# Patient Record
Sex: Female | Born: 1965 | Race: Black or African American | Hispanic: No | Smoking: Never smoker
Health system: Southern US, Community
[De-identification: ages and names within clinical notes are randomized; demographics above are authoritative.]

## PROBLEM LIST (undated history)

## (undated) DIAGNOSIS — H9190 Unspecified hearing loss, unspecified ear: Secondary | ICD-10-CM

## (undated) DIAGNOSIS — I10 Essential (primary) hypertension: Secondary | ICD-10-CM

## (undated) DIAGNOSIS — K219 Gastro-esophageal reflux disease without esophagitis: Secondary | ICD-10-CM

## (undated) DIAGNOSIS — R079 Chest pain, unspecified: Secondary | ICD-10-CM

## (undated) DIAGNOSIS — G5600 Carpal tunnel syndrome, unspecified upper limb: Secondary | ICD-10-CM

## (undated) DIAGNOSIS — M069 Rheumatoid arthritis, unspecified: Secondary | ICD-10-CM

## (undated) HISTORY — DX: Gastro-esophageal reflux disease without esophagitis: K21.9

## (undated) HISTORY — DX: Essential (primary) hypertension: I10

## (undated) HISTORY — DX: Carpal tunnel syndrome, unspecified upper limb: G56.00

## (undated) HISTORY — DX: Rheumatoid arthritis, unspecified: M06.9

## (undated) HISTORY — PX: LAPAROSCOPIC OVARIAN CYSTECTOMY: SUR786

---

## 2009-06-01 ENCOUNTER — Emergency Department (HOSPITAL_COMMUNITY): Admission: EM | Admit: 2009-06-01 | Discharge: 2009-06-01 | Payer: Self-pay | Admitting: Emergency Medicine

## 2010-09-25 LAB — CBC
MCHC: 31.1 g/dL (ref 30.0–36.0)
MCV: 77 fL — ABNORMAL LOW (ref 78.0–100.0)
Platelets: 230 10*3/uL (ref 150–400)
RBC: 3.99 MIL/uL (ref 3.87–5.11)
RDW: 17.5 % — ABNORMAL HIGH (ref 11.5–15.5)

## 2010-09-25 LAB — URINALYSIS, ROUTINE W REFLEX MICROSCOPIC
Glucose, UA: NEGATIVE mg/dL
Nitrite: NEGATIVE
Specific Gravity, Urine: 1.017 (ref 1.005–1.030)
pH: 6.5 (ref 5.0–8.0)

## 2010-09-25 LAB — COMPREHENSIVE METABOLIC PANEL
AST: 23 U/L (ref 0–37)
CO2: 25 mEq/L (ref 19–32)
Calcium: 8.9 mg/dL (ref 8.4–10.5)
Creatinine, Ser: 0.88 mg/dL (ref 0.4–1.2)
GFR calc Af Amer: >60 mL/min (ref >60)
GFR calc non Af Amer: >60 mL/min (ref >60)
Total Protein: 7.4 g/dL (ref 6.0–8.3)

## 2010-09-25 LAB — DIFFERENTIAL
Eosinophils Relative: 2 % (ref 0–5)
Lymphocytes Relative: 45 % (ref 12–46)
Lymphs Abs: 2.8 10*3/uL (ref 0.7–4.0)
Monocytes Relative: 10 % (ref 3–12)

## 2010-09-25 LAB — URINE MICROSCOPIC-ADD ON

## 2010-09-25 LAB — LIPASE, BLOOD: Lipase: 19 U/L (ref 11–59)

## 2011-05-15 ENCOUNTER — Emergency Department (HOSPITAL_COMMUNITY)
Admission: EM | Admit: 2011-05-15 | Discharge: 2011-05-15 | Disposition: A | Discharge status: Home / Self Care | Payer: Medicare Other | Attending: Emergency Medicine | Admitting: Emergency Medicine

## 2011-05-15 DIAGNOSIS — R059 Cough, unspecified: Secondary | ICD-10-CM | POA: Insufficient documentation

## 2011-05-15 DIAGNOSIS — R6883 Chills (without fever): Secondary | ICD-10-CM | POA: Insufficient documentation

## 2011-05-15 DIAGNOSIS — J069 Acute upper respiratory infection, unspecified: Secondary | ICD-10-CM | POA: Insufficient documentation

## 2011-05-15 DIAGNOSIS — R51 Headache: Secondary | ICD-10-CM | POA: Insufficient documentation

## 2011-05-15 DIAGNOSIS — R05 Cough: Secondary | ICD-10-CM | POA: Insufficient documentation

## 2011-05-15 DIAGNOSIS — R5381 Other malaise: Secondary | ICD-10-CM | POA: Insufficient documentation

## 2011-05-15 DIAGNOSIS — R5383 Other fatigue: Secondary | ICD-10-CM | POA: Insufficient documentation

## 2011-05-15 DIAGNOSIS — J3489 Other specified disorders of nose and nasal sinuses: Secondary | ICD-10-CM | POA: Insufficient documentation

## 2011-05-15 DIAGNOSIS — R112 Nausea with vomiting, unspecified: Secondary | ICD-10-CM | POA: Insufficient documentation

## 2011-05-15 HISTORY — DX: Unspecified hearing loss, unspecified ear: H91.90

## 2011-05-15 MED ORDER — BUTALBITAL-APAP-CAFFEINE 50-325-40 MG PO TABS: 2.0000 | ORAL_TABLET | Freq: Four times a day (QID) | ORAL | Status: AC | PRN

## 2011-05-15 MED ORDER — BENZONATATE 100 MG PO CAPS
200.0000 mg | ORAL_CAPSULE | Freq: Once | ORAL | Status: AC
  Administered 2011-05-15: 200 mg via ORAL
  Filled 2011-05-15: qty 2

## 2011-05-15 MED ORDER — BUTALBITAL-APAP-CAFFEINE 50-325-40 MG PO TABS
2.0000 | ORAL_TABLET | ORAL | Status: AC
  Administered 2011-05-15: 2 via ORAL
  Filled 2011-05-15: qty 2

## 2011-05-15 MED ORDER — BENZONATATE 200 MG PO CAPS: 200.0000 mg | ORAL_CAPSULE | Freq: Three times a day (TID) | ORAL | Status: AC | PRN

## 2011-05-15 MED ORDER — ONDANSETRON 4 MG PO TBDP
8.0000 mg | ORAL_TABLET | Freq: Once | ORAL | Status: AC
  Administered 2011-05-15: 8 mg via ORAL
  Filled 2011-05-15: qty 2

## 2011-05-15 MED ORDER — KETOROLAC TROMETHAMINE 60 MG/2ML IM SOLN
60.0000 mg | Freq: Once | INTRAMUSCULAR | Status: AC
  Administered 2011-05-15: 60 mg via INTRAMUSCULAR
  Filled 2011-05-15: qty 2

## 2011-05-15 NOTE — ED Provider Notes (Signed)
History     CSN: [id number] Arrival date & time: 05/15/2011 11:13 AM   First MD Initiated Contact with Patient 05/15/11 1202      Chief Complaint  Patient presents with  . Migraine    (Consider location/radiation/quality/duration/timing/severity/associated sxs/prior treatment) HPI The patient is a 45 year old female with history of hearing impairment who presents today complaining of a migraine. She states that she's had bilateral frontal headache for the past 2 days. There is then associated nausea and vomiting. There is no associated numbness, tingling, or paresthesias. Patient describes generalized weakness but nothing focal. She's had no visual changes and denies photophobia. She has been seen once for this previously during a prior episode and was told that everything was normal. She has not been able to take medications as she vomits when this occurs. She denies any fevers or neck pain. She states the pain is a 10 out of 10. It is made worse when she coughs and better with rest. Patient denies any chest pain or shortness of breath. She has no radiation of her symptoms. There are no other associated or modifying factors. This interview was conducted with the assistance of a family which interpreter. Past Medical History  Diagnosis Date  . Deaf     History reviewed. No pertinent past surgical history.  History reviewed. No pertinent family history.  History  Substance Use Topics  . Smoking status: Not on file  . Smokeless tobacco: Not on file  . Alcohol Use:     OB History    Grav Para Term Preterm Abortions TAB SAB Ect Mult Living                  Review of Systems  Constitutional: Positive for chills.  HENT: Positive for congestion.   Eyes: Negative.   Respiratory: Positive for cough.   Cardiovascular: Negative.   Gastrointestinal: Negative.   Genitourinary: Negative.   Musculoskeletal: Negative.   Neurological: Positive for headaches.  Hematological: Negative.    Psychiatric/Behavioral: Negative.   All other systems reviewed and are negative.    Allergies  Review of patient's allergies indicates no known allergies.  Home Medications   Current Outpatient Rx  Name Route Sig Dispense Refill  . BENZONATATE 200 MG PO CAPS Oral Take 1 capsule (200 mg total) by mouth 3 (three) times daily as needed for cough. 20 capsule 0  . BUTALBITAL-APAP-CAFFEINE 50-325-40 MG PO TABS Oral Take 2 tablets by mouth every 6 (six) hours as needed for headache. 14 tablet 0    BP 117/80  Pulse 95  Temp(Src) 98.5 F (36.9 C) (Oral)  Wt 145 lb (65.772 kg)  SpO2 100%  LMP 04/30/2011  Physical Exam  Nursing note and vitals reviewed. Constitutional: She is oriented to person, place, and time. She appears well-developed and well-nourished. No distress.  HENT:  Head: Normocephalic and atraumatic.  Eyes: Conjunctivae and EOM are normal. Pupils are equal, round, and reactive to light.  Neck: Normal range of motion.  Cardiovascular: Normal rate, regular rhythm, normal heart sounds and intact distal pulses.  Exam reveals no gallop and no friction rub.   No murmur heard. Pulmonary/Chest: Effort normal and breath sounds normal. No respiratory distress. She has no wheezes. She has no rales.  Abdominal: Soft. Bowel sounds are normal. She exhibits no distension. There is no tenderness. There is no rebound and no guarding.  Musculoskeletal: Normal range of motion. She exhibits no edema and no tenderness.  Neurological: She is alert and oriented to person,  place, and time. She exhibits normal muscle tone. Coordination normal.       No cranial nerve deficit aside from hearing impairment  Skin: Skin is warm and dry. No rash noted.  Psychiatric: She has a normal mood and affect.    ED Course  Procedures (including critical care time)  Labs Reviewed - No data to display No results found.   1. Acute URI   2. Headache       MDM  The patient was evaluated by myself.  Based on her history patient's symptoms were similar to prior episode. She did not have any concerning neurologic findings. Patient did have complained of being unable to take medications secondary to nausea. She was given Zofran ODT. Following this patient was given dose of Fioricet. Patient did complain of having intermittent cough associated with symptoms that sounded like upper respiratory tract infection. No further testing was felt necessary today. She will be discharged with a prescription for Tessalon Perles in addition to Fioricet.  Patient also received a dose of Toradol prior to discharge.      Cyndra Numbers, MD 05/15/11 [phone]

## 2011-05-15 NOTE — ED Notes (Signed)
Pt presents with 2 day h/o migraine headache.  Pt reports headache that has been intermittent since 11/7.  Pt reports pain is frontal with soreness to her eyes.  Pt denies any vision change.  +nausea and vomiting, +photophobia.   Pt reports she normally gets migraines with her periods, LMP 11/7.

## 2013-01-11 ENCOUNTER — Encounter: Payer: Self-pay | Admitting: Obstetrics & Gynecology

## 2013-01-13 ENCOUNTER — Encounter: Payer: Self-pay | Admitting: Obstetrics & Gynecology

## 2013-03-03 ENCOUNTER — Ambulatory Visit: Payer: Self-pay | Admitting: Obstetrics & Gynecology

## 2013-03-03 ENCOUNTER — Ambulatory Visit (INDEPENDENT_AMBULATORY_CARE_PROVIDER_SITE_OTHER): Payer: Medicare Other

## 2013-03-03 ENCOUNTER — Other Ambulatory Visit: Payer: Self-pay | Admitting: Obstetrics & Gynecology

## 2013-03-03 ENCOUNTER — Ambulatory Visit (INDEPENDENT_AMBULATORY_CARE_PROVIDER_SITE_OTHER): Payer: 59 | Admitting: Obstetrics & Gynecology

## 2013-03-03 ENCOUNTER — Encounter: Payer: Self-pay | Admitting: Obstetrics & Gynecology

## 2013-03-03 VITALS — BP 160/98 | HR 68 | Temp 98.2°F | Ht 66.0 in | Wt 129.8 lb

## 2013-03-03 DIAGNOSIS — N920 Excessive and frequent menstruation with regular cycle: Secondary | ICD-10-CM

## 2013-03-03 DIAGNOSIS — N760 Acute vaginitis: Secondary | ICD-10-CM

## 2013-03-03 DIAGNOSIS — N946 Dysmenorrhea, unspecified: Secondary | ICD-10-CM

## 2013-03-03 DIAGNOSIS — D259 Leiomyoma of uterus, unspecified: Secondary | ICD-10-CM

## 2013-03-03 DIAGNOSIS — N898 Other specified noninflammatory disorders of vagina: Secondary | ICD-10-CM

## 2013-03-03 DIAGNOSIS — N926 Irregular menstruation, unspecified: Secondary | ICD-10-CM

## 2013-03-03 LAB — CBC
HCT: 36.6 % (ref 36.0–46.0)
Hemoglobin: 10.6 g/dL — ABNORMAL LOW (ref 12.0–15.0)
MCHC: 29 g/dL — ABNORMAL LOW (ref 30.0–36.0)
WBC: 5.5 10*3/uL (ref 4.0–10.5)

## 2013-03-03 LAB — POCT WET PREP (WET MOUNT): Clue Cells Wet Prep Whiff POC: NEGATIVE

## 2013-03-03 NOTE — Progress Notes (Signed)
.   Subjective:     Tina Gray is a 47 y.o. female here for a routine exam.  Current complaints: vaginal itching and irritation.  Heavy, painful long cycles last about two weeks.  Personal health questionnaire reviewed: no.   Gynecologic History No LMP recorded. Contraception: none Last Pap: 11/2011. Results were: normal Last mammogram: 10/2012. Results were: normal  Obstetric History OB History  No data available     The following portions of the patient's history were reviewed and updated as appropriate: allergies, current medications, past family history, past medical history, past social history, past surgical history and problem list.  Review of Systems Pertinent items are noted in HPI.    Objective:   General appearance: alert Breasts: normal appearance, no masses or tenderness Abdomen: soft, non-tender; bowel sounds normal; no masses,  no organomegaly Pelvic: cervix normal in appearance, external genitalia normal, no adnexal masses or tenderness, uterus slightly enlarged, irregular contour  and vagina normal without discharge    Assessment:   AUB--L,O ?Vaginitis  Plan:   Orders Placed This Encounter  Procedures  . WET PREP BY MOLECULAR PROBE  . GC/Chlamydia Probe Amp  . CBC  . TSH  . Prolactin  Return in 2 weeks

## 2013-03-03 NOTE — Patient Instructions (Signed)
Fibroids Fibroids are lumps (tumors) that can occur any place in a woman's body. These lumps are not cancerous. Fibroids vary in size, weight, and where they grow. HOME CARE  Do not take aspirin.  Write down the number of pads or tampons you use during your period. Tell your doctor. This can help determine the best treatment for you. GET HELP RIGHT AWAY IF:  You have pain in your lower belly (abdomen) that is not helped with medicine.  You have cramps that are not helped with medicine.  You have more bleeding between or during your period.  You feel lightheaded or pass out (faint).  Your lower belly pain gets worse. MAKE SURE YOU:  Understand these instructions.  Will watch your condition.  Will get help right away if you are not doing well or get worse. Document Released: 07/13/2010 Document Revised: 09/02/2011 Document Reviewed: 07/13/2010 [facility] Patient Information 2014 [place], [place]. Abnormal Uterine Bleeding Abnormal uterine bleeding can have many causes. Some cases are simply treated, while others are more serious. There are several kinds of bleeding that is considered abnormal, including:  Bleeding between periods.  Bleeding after sexual intercourse.  Spotting anytime in the menstrual cycle.  Bleeding heavier or more than normal.  Bleeding after menopause. CAUSES  There are many causes of abnormal uterine bleeding. It can be present in teenagers, pregnant women, women during their reproductive years, and women who have reached menopause. Your caregiver will look for the more common causes depending on your age, signs, symptoms and your particular circumstance. Most cases are not serious and can be treated. Even the more serious causes, like cancer of the female organs, can be treated adequately if found in the early stages. That is why all types of bleeding should be evaluated and treated as soon as possible. DIAGNOSIS  Diagnosing the cause may take several kinds  of tests. Your caregiver may:  Take a complete history of the type of bleeding.  Perform a complete physical exam and Pap smear.  Take an ultrasound on the abdomen showing a picture of the female organs and the pelvis.  Inject dye into the uterus and Fallopian tubes and X-ray them (hysterosalpingogram).  Place fluid in the uterus and do an ultrasound (sonohysterogrqphy).  Take a CT scan to examine the female organs and pelvis.  Take an MRI to examine the female organs and pelvis. There is no X-ray involved with this procedure.  Look inside the uterus with a telescope that has a light at the end (hysteroscopy).  Scrap the inside of the uterus to get tissue to examine (Dilatation and Curettage, D&C).  Look into the pelvis with a telescope that has a light at the end (laparoscopy). This is done through a very small cut (incision) in the abdomen. TREATMENT  Treatment will depend on the cause of the abnormal bleeding. It can include:  Doing nothing to allow the problem to take care of itself over time.  Hormone treatment.  Birth control pills.  Treating the medical condition causing the problem.  Laparoscopy.  Major or minor surgery  Destroying the lining of the uterus with electrical currant, laser, freezing or heat (uterine ablation). HOME CARE INSTRUCTIONS   Follow your caregiver's recommendation on how to treat your problem.  See your caregiver if you missed a menstrual period and think you may be pregnant.  If you are bleeding heavily, count the number of pads/tampons you use and how often you have to change them. Tell this to your caregiver.  Avoid sexual intercourse until the problem is controlled. SEEK MEDICAL CARE IF:   You have any kind of abnormal bleeding mentioned above.  You feel dizzy at times.  You are 47 years old and have not had a menstrual period yet. SEEK IMMEDIATE MEDICAL CARE IF:   You pass out.  You are changing pads/tampons every 15 to 30  minutes.  You have belly (abdominal) pain.  You have a temperature of 100 F (37.8 C) or higher.  You become sweaty or weak.  You are passing large blood clots from the vagina.  You start to feel sick to your stomach (nauseous) and throw up (vomit). Document Released: 06/10/2005 Document Revised: 09/02/2011 Document Reviewed: 11/03/2008 [facility] Patient Information 2014 [place], [place].

## 2013-03-04 LAB — WET PREP BY MOLECULAR PROBE
Candida species: NEGATIVE
Gardnerella vaginalis: POSITIVE — AB

## 2013-03-04 LAB — PROLACTIN: Prolactin: 4.4 ng/mL

## 2013-03-04 LAB — GC/CHLAMYDIA PROBE AMP: CT Probe RNA: NEGATIVE

## 2013-03-05 ENCOUNTER — Encounter: Payer: Self-pay | Admitting: Obstetrics & Gynecology

## 2013-03-05 DIAGNOSIS — D509 Iron deficiency anemia, unspecified: Secondary | ICD-10-CM | POA: Insufficient documentation

## 2013-03-15 ENCOUNTER — Encounter: Payer: Self-pay | Admitting: Obstetrics & Gynecology

## 2013-03-15 ENCOUNTER — Ambulatory Visit (INDEPENDENT_AMBULATORY_CARE_PROVIDER_SITE_OTHER): Payer: Medicare Other | Admitting: Obstetrics & Gynecology

## 2013-03-15 VITALS — BP 155/92 | HR 73 | Temp 98.2°F | Ht 66.0 in | Wt 127.2 lb

## 2013-03-15 DIAGNOSIS — N926 Irregular menstruation, unspecified: Secondary | ICD-10-CM

## 2013-03-15 DIAGNOSIS — Z23 Encounter for immunization: Secondary | ICD-10-CM

## 2013-03-15 DIAGNOSIS — N939 Abnormal uterine and vaginal bleeding, unspecified: Secondary | ICD-10-CM

## 2013-03-15 NOTE — Progress Notes (Signed)
.   Subjective:     Tina Gray is a 47 y.o. female here for a follow up exam.  No current complaints.  Personal health questionnaire reviewed: no.   Gynecologic History Patient's last menstrual period was 02/02/2013. Contraception: none Last Pap: 2013. Results were: normal Last mammogram: 2014. Results were: normal  Obstetric History OB History  Gravida Para Term Preterm AB SAB TAB Ectopic Multiple Living  2 2        2     # Outcome Date GA Lbr Len/2nd Weight Sex Delivery Anes PTL Lv  2 PAR           1 PAR                The following portions of the patient's history were reviewed and updated as appropriate: allergies, current medications, past family history, past medical history, past social history, past surgical history and problem list.  Review of Systems Pertinent items are noted in HPI.    Objective:   No exam today  Assessment:   AUB-O, perimenopausal Labs, U/S results reviewed  Plan:    Return as needed

## 2013-03-17 ENCOUNTER — Encounter: Payer: Self-pay | Admitting: Obstetrics & Gynecology

## 2013-03-17 DIAGNOSIS — N939 Abnormal uterine and vaginal bleeding, unspecified: Secondary | ICD-10-CM | POA: Insufficient documentation

## 2013-03-17 NOTE — Patient Instructions (Signed)
Influenza Vaccine (Flu Vaccine, Inactivated) 2013 2014 What You Need to Know WHY GET VACCINATED?  Influenza ("flu") is a contagious disease that spreads around the [place] every winter, usually between October and May.  Flu is caused by the influenza virus, and can be spread by coughing, sneezing, and close contact.  Anyone can get flu, but the risk of getting flu is highest among children. Symptoms come on suddenly and may last several days. They can include:  Fever or chills.  Sore throat.  Muscle aches.  Fatigue.  Cough.  Headache.  Runny or stuffy nose. Flu can make some people much sicker than others. These people include young children, people 65 and older, pregnant women, and people with certain health conditions such as heart, lung or kidney disease, or a weakened immune system. Flu vaccine is especially important for these people, and anyone in close contact with them. Flu can also lead to pneumonia, and make existing medical conditions worse. It can cause diarrhea and seizures in children. Each year thousands of people in the [place] die from flu, and many more are hospitalized. Flu vaccine is the best protection we have from flu and its complications. Flu vaccine also helps prevent spreading flu from person to person. INACTIVATED FLU VACCINE There are 2 types of influenza vaccine:  You are getting an inactivated flu vaccine, which does not contain any live influenza virus. It is given by injection with a needle, and often called the "flu shot."  A different live, attenuated (weakened) influenza vaccine is sprayed into the nostrils. This vaccine is described in a separate Vaccine Information Statement. Flu vaccine is recommended every year. Children 6 months through 8 years of age should get 2 doses the first year they get vaccinated. Flu viruses are always changing. Each year's flu vaccine is made to protect from viruses that are most likely to cause disease  that year. While flu vaccine cannot prevent all cases of flu, it is our best defense against the disease. Inactivated flu vaccine protects against 3 or 4 different influenza viruses. It takes about 2 weeks for protection to develop after the vaccination, and protection lasts several months to a year. Some illnesses that are not caused by influenza virus are often mistaken for flu. Flu vaccine will not prevent these illnesses. It can only prevent influenza. A "high-dose" flu vaccine is available for people 65 years of age and older. The person giving you the vaccine can tell you more about it. Some inactivated flu vaccine contains a very small amount of a mercury-based preservative called thimerosal. Studies have shown that thimerosal in vaccines is not harmful, but flu vaccines that do not contain a preservative are available. SOME PEOPLE SHOULD NOT GET THIS VACCINE Tell the person who gives you the vaccine:  If you have any severe (life-threatening) allergies. If you ever had a life-threatening allergic reaction after a dose of flu vaccine, or have a severe allergy to any part of this vaccine, you may be advised not to get a dose. Most, but not all, types of flu vaccine contain a small amount of egg.  If you ever had Guillain Barr Syndrome (a severe paralyzing illness, also called GBS). Some people with a history of GBS should not get this vaccine. This should be discussed with your doctor.  If you are not feeling well. They might suggest waiting until you feel better. But you should come back. RISKS OF A VACCINE REACTION With a vaccine, like any medicine, there   is a chance of side effects. These are usually mild and go away on their own. Serious side effects are also possible, but are very rare. Inactivated flu vaccine does not contain live flu virus, sogetting flu from this vaccine is not possible. Brief fainting spells and related symptoms (such as jerking movements) can happen after any medical  procedure, including vaccination. Sitting or lying down for about 15 minutes after a vaccination can help prevent fainting and injuries caused by falls. Tell your doctor if you feel dizzy or lightheaded, or have vision changes or ringing in the ears. Mild problems following inactivated flu vaccine:  Soreness, redness, or swelling where the shot was given.  Hoarseness; sore, red or itchy eyes; or cough.  Fever.  Aches.  Headache.  Itching.  Fatigue. If these problems occur, they usually begin soon after the shot and last 1 or 2 days. Moderate problems following inactivated flu vaccine:  Young children who get inactivated flu vaccine and pneumococcal vaccine (PCV13) at the same time may be at increased risk for seizures caused by fever. Ask your doctor for more information. Tell your doctor if a child who is getting flu vaccine has ever had a seizure. Severe problems following inactivated flu vaccine:  A severe allergic reaction could occur after any vaccine (estimated less than 1 in a million doses).  There is a small possibility that inactivated flu vaccine could be associated with Guillan Barr Syndrome (GBS), no more than 1 or 2 cases per million people vaccinated. This is much lower than the risk of severe complications from flu, which can be prevented by flu vaccine. The safety of vaccines is always being monitored. For more information, visit: www.cdc.gov/vaccinesafety/ WHAT IF THERE IS A SERIOUS REACTION? What should I look for?  Look for anything that concerns you, such as signs of a severe allergic reaction, very high fever, or behavior changes. Signs of a severe allergic reaction can include hives, swelling of the face and throat, difficulty breathing, a fast heartbeat, dizziness, and weakness. These would start a few minutes to a few hours after the vaccination. What should I do?  If you think it is a severe allergic reaction or other emergency that cannot wait, call 9 1 1  or get the person to the nearest hospital. Otherwise, call your doctor.  Afterward, the reaction should be reported to the Vaccine Adverse Event Reporting System (VAERS). Your doctor might file this report, or you can do it yourself through the VAERS website at www.vaers.hhs.gov, or by calling 1-800-822-7967. VAERS is only for reporting reactions. They do not give medical advice. THE NATIONAL VACCINE INJURY COMPENSATION PROGRAM The National Vaccine Injury Compensation Program (VICP) is a federal program that was created to compensate people who may have been injured by certain vaccines. Persons who believe they may have been injured by a vaccine can learn about the program and about filing a claim by calling 1-800-338-2382 or visiting the VICP website at www.hrsa.gov/vaccinecompensation HOW CAN I LEARN MORE?  Ask your doctor.  Call your local or state health department.  Contact the Centers for Disease Control and Prevention (CDC):  Call 1-800-232-4636 (1-800-CDC-INFO) or  Visit CDC's website at www.cdc.gov/flu CDC Inactivated Influenza Vaccine Interim VIS (01/17/12) Document Released: 04/04/2006 Document Revised: 03/04/2012 Document Reviewed: 01/17/2012 [employer] Patient Information 2014 [employer].  

## 2013-03-18 ENCOUNTER — Ambulatory Visit: Payer: Medicare Other | Admitting: Obstetrics & Gynecology

## 2013-04-05 ENCOUNTER — Telehealth: Payer: Self-pay | Admitting: *Deleted

## 2013-04-05 DIAGNOSIS — B373 Candidiasis of vulva and vagina: Secondary | ICD-10-CM

## 2013-04-05 MED ORDER — FLUCONAZOLE 150 MG PO TABS: 150.0000 mg | ORAL_TABLET | ORAL | Status: DC

## 2013-04-05 NOTE — Telephone Encounter (Signed)
Patient c/o yeast symptoms and would like treatment. Rx sent to patient's pharmacy per office protocol.

## 2013-04-06 ENCOUNTER — Encounter: Payer: Self-pay | Admitting: Obstetrics & Gynecology

## 2013-06-02 ENCOUNTER — Emergency Department (HOSPITAL_COMMUNITY)
Admission: EM | Admit: 2013-06-02 | Discharge: 2013-06-02 | Disposition: A | Discharge status: Home / Self Care | Payer: BC Managed Care – PPO | Attending: Emergency Medicine | Admitting: Emergency Medicine

## 2013-06-02 ENCOUNTER — Encounter (HOSPITAL_COMMUNITY): Payer: Self-pay | Admitting: Emergency Medicine

## 2013-06-02 DIAGNOSIS — R109 Unspecified abdominal pain: Secondary | ICD-10-CM

## 2013-06-02 DIAGNOSIS — Z79899 Other long term (current) drug therapy: Secondary | ICD-10-CM | POA: Insufficient documentation

## 2013-06-02 DIAGNOSIS — R112 Nausea with vomiting, unspecified: Secondary | ICD-10-CM | POA: Diagnosis not present

## 2013-06-02 DIAGNOSIS — R197 Diarrhea, unspecified: Secondary | ICD-10-CM | POA: Diagnosis not present

## 2013-06-02 DIAGNOSIS — Z3202 Encounter for pregnancy test, result negative: Secondary | ICD-10-CM | POA: Insufficient documentation

## 2013-06-02 DIAGNOSIS — H9190 Unspecified hearing loss, unspecified ear: Secondary | ICD-10-CM | POA: Insufficient documentation

## 2013-06-02 LAB — URINALYSIS, ROUTINE W REFLEX MICROSCOPIC
Glucose, UA: NEGATIVE mg/dL
Ketones, ur: NEGATIVE mg/dL
Leukocytes, UA: NEGATIVE
Specific Gravity, Urine: 1.019 (ref 1.005–1.030)
pH: 7 (ref 5.0–8.0)

## 2013-06-02 LAB — POCT PREGNANCY, URINE: Preg Test, Ur: NEGATIVE

## 2013-06-02 LAB — COMPREHENSIVE METABOLIC PANEL
AST: 24 U/L (ref 0–37)
Albumin: 3.7 g/dL (ref 3.5–5.2)
Chloride: 105 mEq/L (ref 96–112)
Creatinine, Ser: 0.91 mg/dL (ref 0.50–1.10)
Potassium: 3.7 mEq/L (ref 3.5–5.1)
Total Bilirubin: 0.3 mg/dL (ref 0.3–1.2)

## 2013-06-02 LAB — CBC WITH DIFFERENTIAL/PLATELET
Basophils Absolute: 0.1 10*3/uL (ref 0.0–0.1)
Lymphocytes Relative: 42 % (ref 12–46)
Lymphs Abs: 2.5 10*3/uL (ref 0.7–4.0)
Monocytes Relative: 10 % (ref 3–12)
Platelets: 240 10*3/uL (ref 150–400)
RDW: 19.4 % — ABNORMAL HIGH (ref 11.5–15.5)
WBC: 5.9 10*3/uL (ref 4.0–10.5)

## 2013-06-02 LAB — LIPASE, BLOOD: Lipase: 17 U/L (ref 11–59)

## 2013-06-02 MED ORDER — SODIUM CHLORIDE 0.9 % IV BOLUS (SEPSIS)
1000.0000 mL | Freq: Once | INTRAVENOUS | Status: AC
  Administered 2013-06-02: 1000 mL via INTRAVENOUS

## 2013-06-02 MED ORDER — MORPHINE SULFATE 4 MG/ML IJ SOLN
4.0000 mg | Freq: Once | INTRAMUSCULAR | Status: AC
  Administered 2013-06-02: 4 mg via INTRAVENOUS
  Filled 2013-06-02: qty 1

## 2013-06-02 NOTE — ED Notes (Addendum)
Dr. Criss Alvine at the bedside with interpreter

## 2013-06-02 NOTE — ED Provider Notes (Signed)
CSN: [id number]     Arrival date & time 06/02/13  1239 History   First MD Initiated Contact with Patient 06/02/13 1434     Chief Complaint  Patient presents with  . Abdominal Pain   (Consider location/radiation/quality/duration/timing/severity/associated sxs/prior Treatment) HPI Comments: 47 year old female presents with 4 days of left-sided abdominal pain. Is associated with a cramping type pain. She's had multiple loose, watery bowel movements. Vomited when this began but has not had any vomiting since. She may felt nauseous about 12 hours ago but currently feels no nausea. The pain seems to be come and go. It is mostly a cramping type pain. She rates as 8/10 at this time. She's not had any travel, recent antibiotics, or fevers. She's never had this before. Denies any urinary or vaginal symptoms. The history is taken through a deaf interpreter who is doing sign language at the bedside.   Past Medical History  Diagnosis Date  . Deaf    Past Surgical History  Procedure Laterality Date  . Laparoscopic ovarian cystectomy     No family history on file. History  Substance Use Topics  . Smoking status: Never Smoker   . Smokeless tobacco: Not on file  . Alcohol Use: No   OB History   Grav Para Term Preterm Abortions TAB SAB Ect Mult Living   2 2        2      Review of Systems  Constitutional: Negative for fever.  Gastrointestinal: Positive for vomiting (once a couple days ago), abdominal pain and diarrhea. Negative for nausea.  Genitourinary: Negative for dysuria, hematuria, vaginal bleeding and vaginal discharge.  Musculoskeletal: Negative for back pain.  All other systems reviewed and are negative.    Allergies  Review of patient's allergies indicates no known allergies.  Home Medications   Current Outpatient Rx  Name  Route  Sig  Dispense  Refill  . aspirin-acetaminophen-caffeine (EXCEDRIN MIGRAINE) 250-250-65 MG per tablet   Oral   Take 1 tablet by mouth every 6 (six)  hours as needed for headache.          BP 166/95  Pulse 60  Temp(Src) 98.4 F (36.9 C) (Oral)  Resp 22  Ht 5\' 5"  (1.651 m)  Wt 127 lb 12.8 oz (57.97 kg)  BMI 21.27 kg/m2  SpO2 100%  LMP 05/13/2013 Physical Exam  Vitals reviewed. Constitutional: She is oriented to person, place, and time. She appears well-developed and well-nourished. No distress.  HENT:  Head: Normocephalic and atraumatic.  Right Ear: External ear normal.  Left Ear: External ear normal.  Nose: Nose normal.  Mouth/Throat: Oropharynx is clear and moist.  Eyes: Right eye exhibits no discharge. Left eye exhibits no discharge.  Cardiovascular: Normal rate, regular rhythm and normal heart sounds.   Pulmonary/Chest: Effort normal and breath sounds normal.  Abdominal: Soft. There is no tenderness.  Soft, no tenderness when patient is distracted  Neurological: She is alert and oriented to person, place, and time.  Skin: Skin is warm and dry.    ED Course  Procedures (including critical care time) Labs Review Labs Reviewed  CBC WITH DIFFERENTIAL - Abnormal; Notable for the following:    RBC 5.19 (*)    Hemoglobin 10.7 (*)    HCT 35.5 (*)    MCV 68.4 (*)    MCH 20.6 (*)    RDW 19.4 (*)    All other components within normal limits  COMPREHENSIVE METABOLIC PANEL - Abnormal; Notable for the following:    GFR  calc non Af Amer 73 (*)    GFR calc Af Amer 84 (*)    All other components within normal limits  URINALYSIS, ROUTINE W REFLEX MICROSCOPIC - Abnormal; Notable for the following:    APPearance CLOUDY (*)    All other components within normal limits  LIPASE, BLOOD  POCT PREGNANCY, URINE   Imaging Review No results found.  EKG Interpretation   None       MDM   1. Diarrhea   2. Abdominal cramping    Her abd exam is benign, has soft, non-focal tenderness. Labs benign. Given her multiple loose, watery stools her cramping is likely from this. I have a low suspicion for colitis, diverticulitis, GU  pathology, kidney stone or pancreatitis. Improved with fluids and pain meds. No red flags to suggest needing further treatment or eval. Discussed strict return precautions and importance of hydration.    Audree Camel, MD 06/03/13 [phone]

## 2013-06-02 NOTE — ED Notes (Signed)
Sign language interpreter called, will be at hospital in 30 minutes.

## 2013-06-02 NOTE — ED Notes (Signed)
Abdominal cramping n/v/d  Since last Friday intermittent

## 2013-06-02 NOTE — ED Notes (Signed)
Meriam Sprague, Nursing Secretary called for Deaf interrupter

## 2013-09-13 ENCOUNTER — Ambulatory Visit: Payer: Self-pay | Admitting: Obstetrics & Gynecology

## 2014-04-25 ENCOUNTER — Encounter (HOSPITAL_COMMUNITY): Payer: Self-pay | Admitting: Emergency Medicine

## 2014-06-20 ENCOUNTER — Encounter: Payer: Self-pay | Admitting: *Deleted

## 2014-06-21 ENCOUNTER — Encounter: Payer: Self-pay | Admitting: Obstetrics & Gynecology

## 2014-11-11 ENCOUNTER — Other Ambulatory Visit: Payer: Self-pay | Admitting: Internal Medicine

## 2014-11-11 ENCOUNTER — Ambulatory Visit
Admission: RE | Admit: 2014-11-11 | Discharge: 2014-11-11 | Disposition: A | Discharge status: Home / Self Care | Payer: Medicaid Other | Source: Ambulatory Visit | Attending: Internal Medicine | Admitting: Internal Medicine

## 2014-11-11 DIAGNOSIS — M542 Cervicalgia: Secondary | ICD-10-CM

## 2015-01-09 ENCOUNTER — Emergency Department (HOSPITAL_COMMUNITY)
Admission: EM | Admit: 2015-01-09 | Discharge: 2015-01-09 | Disposition: A | Discharge status: Home / Self Care | Payer: Medicare Other | Attending: Emergency Medicine | Admitting: Emergency Medicine

## 2015-01-09 ENCOUNTER — Encounter (HOSPITAL_COMMUNITY): Payer: Self-pay | Admitting: *Deleted

## 2015-01-09 ENCOUNTER — Emergency Department (HOSPITAL_COMMUNITY): Payer: Medicare Other

## 2015-01-09 DIAGNOSIS — Y939 Activity, unspecified: Secondary | ICD-10-CM | POA: Insufficient documentation

## 2015-01-09 DIAGNOSIS — Y929 Unspecified place or not applicable: Secondary | ICD-10-CM | POA: Insufficient documentation

## 2015-01-09 DIAGNOSIS — T188XXA Foreign body in other parts of alimentary tract, initial encounter: Secondary | ICD-10-CM | POA: Insufficient documentation

## 2015-01-09 DIAGNOSIS — H9190 Unspecified hearing loss, unspecified ear: Secondary | ICD-10-CM | POA: Diagnosis not present

## 2015-01-09 DIAGNOSIS — X58XXXA Exposure to other specified factors, initial encounter: Secondary | ICD-10-CM | POA: Insufficient documentation

## 2015-01-09 DIAGNOSIS — R1310 Dysphagia, unspecified: Secondary | ICD-10-CM | POA: Diagnosis not present

## 2015-01-09 DIAGNOSIS — Z79899 Other long term (current) drug therapy: Secondary | ICD-10-CM | POA: Insufficient documentation

## 2015-01-09 DIAGNOSIS — Y998 Other external cause status: Secondary | ICD-10-CM | POA: Diagnosis not present

## 2015-01-09 DIAGNOSIS — J029 Acute pharyngitis, unspecified: Secondary | ICD-10-CM | POA: Diagnosis present

## 2015-01-09 DIAGNOSIS — T189XXA Foreign body of alimentary tract, part unspecified, initial encounter: Secondary | ICD-10-CM

## 2015-01-09 NOTE — ED Provider Notes (Signed)
CSN: [id number]     Arrival date & time 01/09/15  1528 History   First MD Initiated Contact with Patient 01/09/15 1657     Chief Complaint  Patient presents with  . Sore Throat     (Consider location/radiation/quality/duration/timing/severity/associated sxs/prior Treatment) HPI The patient reports that she was eating chicken on Thursday, and it felt like a bone got stuck. She reports that she saw her doctor and they did not identify if there was something there are not. She reports that she was drinking water and able to eat without limitation and thus they suggested that she continue to try that over the weekend and see if it improved. As she is still feeling discomfort she was advised to come to the emergency department for evaluation. She has not had any vomiting or inability to eat or difficulty breathing. She continues to feel an irritating sensation in her throat that is sometimes slightly to the left and then she feels that move up and down a little bit. Past Medical History  Diagnosis Date  . Deaf    Past Surgical History  Procedure Laterality Date  . Laparoscopic ovarian cystectomy     No family history on file. History  Substance Use Topics  . Smoking status: Never Smoker   . Smokeless tobacco: Not on file  . Alcohol Use: No   OB History    Gravida Para Term Preterm AB TAB SAB Ectopic Multiple Living   2 2        2      Review of Systems GI: No vomiting no abdominal pain History: No shortness of breath, no difficulty breathing no coughing.   Allergies  Review of patient's allergies indicates no known allergies.  Home Medications   Prior to Admission medications   Medication Sig Start Date End Date Taking? Authorizing Provider  cyclobenzaprine (FLEXERIL) 10 MG tablet Take 10 mg by mouth 2 (two) times daily. 01/08/15  Yes Historical Provider, MD  lisinopril (PRINIVIL,ZESTRIL) 20 MG tablet Take 20 mg by mouth daily. 01/05/15  Yes Historical Provider, MD  naproxen  (NAPROSYN) 500 MG tablet Take 500 mg by mouth daily. 01/09/15  Yes Historical Provider, MD   BP 157/99 mmHg  Pulse 72  Temp(Src) 98.4 F (36.9 C) (Oral)  Resp 18  Wt 135 lb (61.236 kg)  SpO2 100%  LMP 06/10/2014 Physical Exam  Constitutional: She is oriented to person, place, and time. She appears well-developed and well-nourished. No distress.  HENT:  Head: Normocephalic and atraumatic.  Mouth/Throat: No oropharyngeal exudate.  Posterior oropharynx is widely patent. No evidence of injury or swelling.  Eyes: EOM are normal. Pupils are equal, round, and reactive to light.  Neck: Neck supple. No tracheal deviation present. No thyromegaly present.  Neck is supple there is no palpable mass, meningismus or trismus. He does endorse some tenderness to palpation along the anterior sternocleidomastoid and soft tissues lateral to the trachea. There is however no palpable abnormality, no stridor.  Cardiovascular: Normal rate, regular rhythm and normal heart sounds.   Pulmonary/Chest: Effort normal and breath sounds normal. No stridor.  Lymphadenopathy:    She has no cervical adenopathy.  Neurological: She is alert and oriented to person, place, and time. No cranial nerve deficit. Coordination normal.  Skin: Skin is warm and dry.  Psychiatric: She has a normal mood and affect.    ED Course  Procedures (including critical care time) Labs Review Labs Reviewed - No data to display  Imaging Review Dg Neck Soft Tissue  01/09/2015   CLINICAL DATA:  49 year old female with concern for a chicken bone stuck in her throat  EXAM: NECK SOFT TISSUES - 1+ VIEW  COMPARISON:  Radiograph 05/20 /2016  FINDINGS: There is no evidence of acute fracture or subluxation of the cervical spine. There are stable degenerative changes most prominent at C5-C6. The soft tissues are unremarkable. No radiopaque foreign object identified.  IMPRESSION: No radiopaque foreign object.   Electronically Signed   By: Anner Crete  M.D.   On: 01/09/2015 17:38     EKG Interpretation None     Consult: Patient's case reviewed with Dr. Constance Holster. At this point with the patient asymptomatic she is advised to follow-up in the office tomorrow for recheck. MDM   Final diagnoses:  Swallowed chicken bone, initial encounter  Dysphagia   Patient presents with a feeling of dysphagia for 4 days. She traces is back to having swallowed a chicken bone. The patient is however well with no signs of being toxic or any evidence of developing infection or airway or GI obstruction. At this point she is counseled necessity of follow-up with ENT for potential scope of the upper airway and throat determine if there is any traumatic type of abrasion or injury versus a small retained foreign that is not visible on plain film x-ray. By examination she shows no sign of abscess or perforation.    Charlesetta Shanks, MD 01/09/15 [phone]

## 2015-01-09 NOTE — ED Notes (Signed)
Called for sign language interpreter. Pt is Deaf

## 2015-01-09 NOTE — ED Notes (Signed)
Called for interperter

## 2015-01-09 NOTE — Discharge Instructions (Signed)
Swallowed Foreign Body, Adult You have swallowed an object (foreign body). Once the foreign body has passed through the food tube (esophagus), which leads from the mouth to the stomach, it will usually continue through the body without problems. This is because the point where the esophagus enters into the stomach is the narrowest place through which the foreign body must pass. Sometimes the foreign body gets stuck. The most common type of foreign body obstruction in adults is food impaction. Many times, bones from fish or meat products may become lodged in the esophagus or injure the throat on the way down. When there is an object that obstructs the esophagus, the most obvious symptoms are pain and the inability to swallow normally. In some cases, foreign bodies that can be life threatening are swallowed. Examples of these are certain medications and illicit drugs. Often in these instances, patients are afraid of telling what they swallowed. However, it is extremely important to tell the emergency caregiver what was swallowed because life-saving treatment may be needed.  X-ray exams may be taken to find the location of the foreign body. However, some objects do not show up well or may be too small to be seen on an X-ray image. If the foreign body is too large or too sharp, it may be too dangerous to allow it to pass on its own. You may need to see a caregiver who specializes in the throat or stomach. In a few cases, a specialist may need to remove the object using a method called "endoscopy". This involves passing a thin, soft, flexible tube into the food pipe to locate and remove the object. Follow up with your primary doctor or the referral you were given by the emergency caregiver. HOME CARE INSTRUCTIONS   If your caregiver says it is safe for you to eat, then only have liquids and soft foods until your symptoms improve.  Once you are eating normally:  Cut food into small pieces.  Remove small bones  from food.  Remove large seeds and pits from fruit.  Chew your food well.  Do not talk, laugh, or engage in physical activity while eating or swallowing. SEEK MEDICAL CARE IF:  You develop worsening shortness of breath, uncontrollable coughing, chest pains or high fever, greater than 102 F (38.9 C).  You are unable to eat or drink or you feel that food is getting stuck in your throat.  You have choking symptoms or cannot stop drooling.  You develop abdominal pain, vomiting (especially of blood), or rectal bleeding. MAKE SURE YOU:   Understand these instructions.  Will watch your condition.  Will get help right away if you are not doing well or get worse. Document Released: 11/28/2009 Document Revised: 09/02/2011 Document Reviewed: 11/28/2009 [facility] Patient Information 2015 [place], [place]. This information is not intended to replace advice given to you by your health care provider. Make sure you discuss any questions you have with your health care provider.

## 2015-01-09 NOTE — ED Notes (Signed)
Pt staets that she has had a chicken bone stuck in her throat for 4 days. States that she has tried drinking water and eating but it will not pass. NAD noted

## 2015-03-02 ENCOUNTER — Other Ambulatory Visit: Payer: Self-pay | Admitting: Internal Medicine

## 2015-03-02 DIAGNOSIS — M5412 Radiculopathy, cervical region: Secondary | ICD-10-CM

## 2015-06-09 ENCOUNTER — Emergency Department (HOSPITAL_COMMUNITY)
Admission: EM | Admit: 2015-06-09 | Discharge: 2015-06-09 | Disposition: A | Discharge status: Home / Self Care | Payer: Medicare Other | Attending: Emergency Medicine | Admitting: Emergency Medicine

## 2015-06-09 ENCOUNTER — Encounter (HOSPITAL_COMMUNITY): Payer: Self-pay | Admitting: Emergency Medicine

## 2015-06-09 DIAGNOSIS — I10 Essential (primary) hypertension: Secondary | ICD-10-CM | POA: Diagnosis not present

## 2015-06-09 DIAGNOSIS — G43909 Migraine, unspecified, not intractable, without status migrainosus: Secondary | ICD-10-CM | POA: Insufficient documentation

## 2015-06-09 DIAGNOSIS — H913 Deaf nonspeaking, not elsewhere classified: Secondary | ICD-10-CM | POA: Insufficient documentation

## 2015-06-09 DIAGNOSIS — Z791 Long term (current) use of non-steroidal anti-inflammatories (NSAID): Secondary | ICD-10-CM | POA: Diagnosis not present

## 2015-06-09 DIAGNOSIS — Z79899 Other long term (current) drug therapy: Secondary | ICD-10-CM | POA: Diagnosis not present

## 2015-06-09 MED ORDER — METOCLOPRAMIDE HCL 5 MG/ML IJ SOLN
10.0000 mg | Freq: Once | INTRAMUSCULAR | Status: AC
  Administered 2015-06-09: 10 mg via INTRAVENOUS
  Filled 2015-06-09: qty 2

## 2015-06-09 MED ORDER — MAGNESIUM SULFATE 2 GM/50ML IV SOLN
2.0000 g | Freq: Once | INTRAVENOUS | Status: AC
  Administered 2015-06-09: 2 g via INTRAVENOUS
  Filled 2015-06-09: qty 50

## 2015-06-09 MED ORDER — SODIUM CHLORIDE 0.9 % IV BOLUS (SEPSIS)
1000.0000 mL | Freq: Once | INTRAVENOUS | Status: AC
  Administered 2015-06-09: 1000 mL via INTRAVENOUS

## 2015-06-09 MED ORDER — DIPHENHYDRAMINE HCL 50 MG/ML IJ SOLN
25.0000 mg | Freq: Once | INTRAMUSCULAR | Status: AC
  Administered 2015-06-09: 25 mg via INTRAVENOUS
  Filled 2015-06-09: qty 1

## 2015-06-09 MED ORDER — KETOROLAC TROMETHAMINE 30 MG/ML IJ SOLN
30.0000 mg | Freq: Once | INTRAMUSCULAR | Status: AC
  Administered 2015-06-09: 30 mg via INTRAVENOUS
  Filled 2015-06-09: qty 1

## 2015-06-09 NOTE — ED Notes (Signed)
Pt ambulated to restroom. 

## 2015-06-09 NOTE — ED Notes (Signed)
Bed: [id number] Expected date:  Expected time:  Means of arrival:  Comments: EMS 49 yo female from [facility], migraine

## 2015-06-09 NOTE — Discharge Instructions (Signed)

## 2015-06-09 NOTE — ED Notes (Addendum)
Holly Stettler reports unsuccessful IV attempt. This nurse attempt at present time. Interpreter at bedside.

## 2015-06-09 NOTE — ED Provider Notes (Signed)
CSN: [id number]     Arrival date & time 06/09/15  0704 History   First MD Initiated Contact with Patient 06/09/15 [phone]     Chief Complaint  Patient presents with  . Migraine     (Consider location/radiation/quality/duration/timing/severity/associated sxs/prior Treatment) HPI Comments: 49 year old female with hypertension and migraines who presents with headache. Patient states that she was at work when she began having headaches similar to previous migraines around 3 AM. She took some medication and it seemed to get better but then got worse again at 5 AM. She has had some nausea but no vomiting. No neck stiffness, fevers, or recent illness. No visual changes or unilateral extremity weakness. She states that these symptoms are like previous migraines.  Patient is a 49 y.o. female presenting with migraines. The history is provided by the patient. A language interpreter was used.  Migraine    Past Medical History  Diagnosis Date  . Deaf    Past Surgical History  Procedure Laterality Date  . Laparoscopic ovarian cystectomy     No family history on file. Social History  Substance Use Topics  . Smoking status: Never Smoker   . Smokeless tobacco: None  . Alcohol Use: No   OB History    Gravida Para Term Preterm AB TAB SAB Ectopic Multiple Living   2 2        2      Review of Systems 10 Systems reviewed and are negative for acute change except as noted in the HPI.    Allergies  Review of patient's allergies indicates no known allergies.  Home Medications   Prior to Admission medications   Medication Sig Start Date End Date Taking? Authorizing Provider  cyclobenzaprine (FLEXERIL) 10 MG tablet Take 10 mg by mouth 3 (three) times daily as needed for muscle spasms.  01/08/15  Yes Historical Provider, MD  lisinopril (PRINIVIL,ZESTRIL) 20 MG tablet Take 20 mg by mouth daily. 01/05/15  Yes Historical Provider, MD  naproxen (NAPROSYN) 500 MG tablet Take 500 mg by mouth at bedtime.   01/09/15  Yes Historical Provider, MD   BP 156/95 mmHg  Pulse 78  Temp(Src) 97.6 F (36.4 C) (Oral)  Resp 18  SpO2 100% Physical Exam  Constitutional: She is oriented to person, place, and time. She appears well-developed and well-nourished. No distress.  Awake, alert  HENT:  Head: Normocephalic and atraumatic.  Eyes: Conjunctivae and EOM are normal. Pupils are equal, round, and reactive to light.  Neck: Normal range of motion. Neck supple.  Cardiovascular: Normal rate, regular rhythm and normal heart sounds.   No murmur heard. Pulmonary/Chest: Effort normal and breath sounds normal. No respiratory distress.  Abdominal: Soft. Bowel sounds are normal. She exhibits no distension.  Musculoskeletal: She exhibits no edema.  Neurological: She is alert and oriented to person, place, and time. She has normal reflexes. She exhibits normal muscle tone.  Deaf but the remainder of CN intact, normal finger-to-nose testing; normal strength and sensation throughout  Skin: Skin is warm and dry.  Psychiatric: She has a normal mood and affect. Judgment and thought content normal.  Nursing note and vitals reviewed.   ED Course  Procedures (including critical care time) Labs Review Labs Reviewed - No data to display   EKG Interpretation None     Medications  diphenhydrAMINE (BENADRYL) injection 25 mg (25 mg Intravenous Given 06/09/15 0843)  metoCLOPramide (REGLAN) injection 10 mg (10 mg Intravenous Given 06/09/15 0843)  sodium chloride 0.9 % bolus 1,000 mL (0 mLs  Intravenous Stopped 06/09/15 1230)  ketorolac (TORADOL) 30 MG/ML injection 30 mg (30 mg Intravenous Given 06/09/15 0930)  magnesium sulfate IVPB 2 g 50 mL (0 g Intravenous Stopped 06/09/15 1139)  diphenhydrAMINE (BENADRYL) injection 25 mg (25 mg Intravenous Given 06/09/15 1227)  metoCLOPramide (REGLAN) injection 10 mg (10 mg Intravenous Given 06/09/15 1227)     MDM   Final diagnoses:  Migraine without status migrainosus, not  intractable, unspecified migraine type   PT p/w headache similar to previous migraines, no recent illness or fevers. On exam, she was uncomfortable but well appearing. VS notable for hypertension. Normal neurologic exam w/ exception of her deafness. Gave IV benadryl, reglan, fluids. Later repeated these medications and added toradol, magnesium for ongoing pain.  On reexamination after several hours in ED, pt was ambulatory and stating that she felt better and wanted to go home. Reviewed supportive care instructions for headache. The patient denies any neurologic symptoms such as visual changes, focal numbness/weakness, balance problems, confusion, or speech difficulty to suggest a life-threatening intracranial process such as intracranial hemorrhage or mass. The patient has no clotting risk factors thus venous sinus thrombosis is unlikely.  I feel that the patient is safe for discharge home without any head imaging at this time. I have reviewed return precautions including development of neurologic symptoms, confusion, lethargy, or difficulty speaking and patient has voiced understanding. Pt discharged in satisfactory condition.     Sharlett Iles, MD 06/11/15 [phone]

## 2015-06-09 NOTE — ED Notes (Signed)
Secretary notified that [employer] Sign Language Interpreter would be needed

## 2015-06-09 NOTE — ED Notes (Signed)
Per EMS report: pt was working at [employer] when she began having a migraine at 3am. Pt hx of migraines and reports having some nausea.  Pt a/o x 4 and ambulatory.

## 2015-11-22 DIAGNOSIS — I10 Essential (primary) hypertension: Secondary | ICD-10-CM | POA: Diagnosis not present

## 2015-11-22 DIAGNOSIS — Z029 Encounter for administrative examinations, unspecified: Secondary | ICD-10-CM | POA: Diagnosis not present

## 2015-12-02 ENCOUNTER — Observation Stay (HOSPITAL_COMMUNITY)
Admission: EM | Admit: 2015-12-02 | Discharge: 2015-12-04 | Disposition: A | Discharge status: Home / Self Care | Payer: BLUE CROSS/BLUE SHIELD | Attending: Internal Medicine | Admitting: Internal Medicine

## 2015-12-02 ENCOUNTER — Emergency Department (HOSPITAL_COMMUNITY): Payer: BLUE CROSS/BLUE SHIELD

## 2015-12-02 ENCOUNTER — Encounter (HOSPITAL_COMMUNITY): Payer: Self-pay

## 2015-12-02 DIAGNOSIS — Z79899 Other long term (current) drug therapy: Secondary | ICD-10-CM | POA: Diagnosis not present

## 2015-12-02 DIAGNOSIS — H9190 Unspecified hearing loss, unspecified ear: Secondary | ICD-10-CM | POA: Diagnosis not present

## 2015-12-02 DIAGNOSIS — R7989 Other specified abnormal findings of blood chemistry: Secondary | ICD-10-CM

## 2015-12-02 DIAGNOSIS — R0602 Shortness of breath: Secondary | ICD-10-CM | POA: Diagnosis not present

## 2015-12-02 DIAGNOSIS — N179 Acute kidney failure, unspecified: Secondary | ICD-10-CM | POA: Insufficient documentation

## 2015-12-02 DIAGNOSIS — R079 Chest pain, unspecified: Secondary | ICD-10-CM | POA: Diagnosis present

## 2015-12-02 DIAGNOSIS — R748 Abnormal levels of other serum enzymes: Secondary | ICD-10-CM | POA: Diagnosis not present

## 2015-12-02 DIAGNOSIS — R778 Other specified abnormalities of plasma proteins: Secondary | ICD-10-CM | POA: Diagnosis present

## 2015-12-02 DIAGNOSIS — R05 Cough: Secondary | ICD-10-CM | POA: Diagnosis not present

## 2015-12-02 DIAGNOSIS — R072 Precordial pain: Principal | ICD-10-CM | POA: Insufficient documentation

## 2015-12-02 DIAGNOSIS — Z8249 Family history of ischemic heart disease and other diseases of the circulatory system: Secondary | ICD-10-CM | POA: Insufficient documentation

## 2015-12-02 DIAGNOSIS — E876 Hypokalemia: Secondary | ICD-10-CM | POA: Diagnosis not present

## 2015-12-02 DIAGNOSIS — I10 Essential (primary) hypertension: Secondary | ICD-10-CM | POA: Diagnosis present

## 2015-12-02 DIAGNOSIS — Z791 Long term (current) use of non-steroidal anti-inflammatories (NSAID): Secondary | ICD-10-CM | POA: Insufficient documentation

## 2015-12-02 DIAGNOSIS — M79601 Pain in right arm: Secondary | ICD-10-CM | POA: Diagnosis present

## 2015-12-02 HISTORY — DX: Chest pain, unspecified: R07.9

## 2015-12-02 LAB — CBC WITH DIFFERENTIAL/PLATELET
Basophils Absolute: 0 10*3/uL (ref 0.0–0.1)
Basophils Relative: 0 %
Eosinophils Absolute: 0.2 10*3/uL (ref 0.0–0.7)
Eosinophils Relative: 3 %
HCT: 43.2 % (ref 36.0–46.0)
Hemoglobin: 13.7 g/dL (ref 12.0–15.0)
Lymphocytes Relative: 20 %
Lymphs Abs: 1.4 10*3/uL (ref 0.7–4.0)
MCH: 27.1 pg (ref 26.0–34.0)
MCHC: 31.7 g/dL (ref 30.0–36.0)
MCV: 85.5 fL (ref 78.0–100.0)
Monocytes Absolute: 0.5 10*3/uL (ref 0.1–1.0)
Monocytes Relative: 7 %
Neutro Abs: 4.9 10*3/uL (ref 1.7–7.7)
Neutrophils Relative %: 70 %
Platelets: 210 10*3/uL (ref 150–400)
RBC: 5.05 MIL/uL (ref 3.87–5.11)
RDW: 15.6 % — ABNORMAL HIGH (ref 11.5–15.5)
WBC: 7.1 10*3/uL (ref 4.0–10.5)

## 2015-12-02 LAB — BASIC METABOLIC PANEL
Anion gap: 10 (ref 5–15)
BUN: 14 mg/dL (ref 6–20)
CO2: 28 mmol/L (ref 22–32)
Calcium: 9.5 mg/dL (ref 8.9–10.3)
Chloride: 99 mmol/L — ABNORMAL LOW (ref 101–111)
Creatinine, Ser: 1.04 mg/dL — ABNORMAL HIGH (ref 0.44–1.00)
GFR calc Af Amer: >60 mL/min (ref >60)
GFR calc non Af Amer: >60 mL/min (ref >60)
Glucose, Bld: 66 mg/dL (ref 65–99)
Potassium: 3.4 mmol/L — ABNORMAL LOW (ref 3.5–5.1)
Sodium: 137 mmol/L (ref 135–145)

## 2015-12-02 LAB — TROPONIN I
TROPONIN I: 0.04 ng/mL — AB (ref <0.031)
Troponin I: 0.05 ng/mL — ABNORMAL HIGH (ref <0.031)
Troponin I: 0.05 ng/mL — ABNORMAL HIGH (ref <0.031)
Troponin I: 0.05 ng/mL — ABNORMAL HIGH (ref <0.031)

## 2015-12-02 LAB — TSH: TSH: 0.601 u[IU]/mL (ref 0.350–4.500)

## 2015-12-02 MED ORDER — SODIUM CHLORIDE 0.9% FLUSH: 3.0000 mL | INTRAVENOUS | Status: DC | PRN

## 2015-12-02 MED ORDER — ENOXAPARIN SODIUM 40 MG/0.4ML ~~LOC~~ SOLN: 40.0000 mg | SUBCUTANEOUS | Status: DC

## 2015-12-02 MED ORDER — BISACODYL 10 MG RE SUPP: 10.0000 mg | Freq: Every day | RECTAL | Status: DC | PRN

## 2015-12-02 MED ORDER — METHOCARBAMOL 500 MG PO TABS: 500.0000 mg | ORAL_TABLET | Freq: Four times a day (QID) | ORAL | Status: DC | PRN

## 2015-12-02 MED ORDER — NAPROXEN 250 MG PO TABS: 500.0000 mg | ORAL_TABLET | Freq: Every day | ORAL | Status: DC

## 2015-12-02 MED ORDER — POLYETHYLENE GLYCOL 3350 17 G PO PACK: 17.0000 g | PACK | Freq: Every day | ORAL | Status: DC | PRN

## 2015-12-02 MED ORDER — KETOROLAC TROMETHAMINE 15 MG/ML IJ SOLN
15.0000 mg | Freq: Once | INTRAMUSCULAR | Status: AC
  Administered 2015-12-02: 15 mg via INTRAMUSCULAR

## 2015-12-02 MED ORDER — PANTOPRAZOLE SODIUM 40 MG PO TBEC
40.0000 mg | DELAYED_RELEASE_TABLET | Freq: Once | ORAL | Status: AC
  Administered 2015-12-02: 40 mg via ORAL
  Filled 2015-12-02: qty 1

## 2015-12-02 MED ORDER — ASPIRIN 81 MG PO CHEW
324.0000 mg | CHEWABLE_TABLET | Freq: Once | ORAL | Status: AC
  Administered 2015-12-02: 324 mg via ORAL
  Filled 2015-12-02: qty 4

## 2015-12-02 MED ORDER — LISINOPRIL 10 MG PO TABS
20.0000 mg | ORAL_TABLET | Freq: Every day | ORAL | Status: DC
  Administered 2015-12-02: 20 mg via ORAL
  Filled 2015-12-02: qty 1
  Filled 2015-12-02: qty 2

## 2015-12-02 MED ORDER — SODIUM CHLORIDE 0.9% FLUSH
3.0000 mL | Freq: Two times a day (BID) | INTRAVENOUS | Status: DC
  Administered 2015-12-03 – 2015-12-04 (×2): 3 mL via INTRAVENOUS

## 2015-12-02 MED ORDER — SODIUM CHLORIDE 0.9 % IV SOLN: 250.0000 mL | INTRAVENOUS | Status: DC | PRN

## 2015-12-02 MED ORDER — GI COCKTAIL ~~LOC~~
30.0000 mL | Freq: Once | ORAL | Status: AC
  Administered 2015-12-02: 30 mL via ORAL
  Filled 2015-12-02: qty 30

## 2015-12-02 MED ORDER — ACETAMINOPHEN 325 MG PO TABS: 650.0000 mg | ORAL_TABLET | ORAL | Status: DC | PRN

## 2015-12-02 MED ORDER — POTASSIUM CHLORIDE CRYS ER 20 MEQ PO TBCR
40.0000 meq | EXTENDED_RELEASE_TABLET | Freq: Two times a day (BID) | ORAL | Status: DC
  Administered 2015-12-02 – 2015-12-04 (×4): 40 meq via ORAL
  Filled 2015-12-02 (×4): qty 2

## 2015-12-02 MED ORDER — ONDANSETRON HCL 4 MG PO TABS: 4.0000 mg | ORAL_TABLET | Freq: Four times a day (QID) | ORAL | Status: DC | PRN

## 2015-12-02 MED ORDER — KETOROLAC TROMETHAMINE 15 MG/ML IJ SOLN
15.0000 mg | Freq: Once | INTRAMUSCULAR | Status: DC
  Filled 2015-12-02: qty 1

## 2015-12-02 MED ORDER — KETOROLAC TROMETHAMINE 15 MG/ML IJ SOLN: 15.0000 mg | Freq: Once | INTRAMUSCULAR | Status: DC

## 2015-12-02 MED ORDER — ONDANSETRON HCL 4 MG/2ML IJ SOLN
4.0000 mg | Freq: Four times a day (QID) | INTRAMUSCULAR | Status: DC | PRN
Start: 2015-12-02 — End: 2015-12-04
  Filled 2015-12-02: qty 2

## 2015-12-02 MED ORDER — SODIUM CHLORIDE 0.9% FLUSH: 3.0000 mL | Freq: Two times a day (BID) | INTRAVENOUS | Status: DC

## 2015-12-02 MED ORDER — MORPHINE SULFATE (PF) 2 MG/ML IV SOLN: 1.0000 mg | INTRAVENOUS | Status: DC | PRN

## 2015-12-02 MED ORDER — CYCLOBENZAPRINE HCL 10 MG PO TABS: 10.0000 mg | ORAL_TABLET | Freq: Three times a day (TID) | ORAL | Status: DC | PRN

## 2015-12-02 MED ORDER — ONDANSETRON HCL 4 MG/2ML IJ SOLN: 4.0000 mg | Freq: Four times a day (QID) | INTRAMUSCULAR | Status: DC | PRN

## 2015-12-02 MED ORDER — PANTOPRAZOLE SODIUM 40 MG PO TBEC
40.0000 mg | DELAYED_RELEASE_TABLET | Freq: Two times a day (BID) | ORAL | Status: DC
  Administered 2015-12-02 – 2015-12-04 (×4): 40 mg via ORAL
  Filled 2015-12-02 (×4): qty 1

## 2015-12-02 MED ORDER — HYDROCODONE-ACETAMINOPHEN 5-325 MG PO TABS
1.0000 | ORAL_TABLET | ORAL | Status: DC | PRN
  Administered 2015-12-02: 2 via ORAL
  Administered 2015-12-02 – 2015-12-04 (×6): 1 via ORAL
  Filled 2015-12-02: qty 1
  Filled 2015-12-02 (×2): qty 2
  Filled 2015-12-02 (×2): qty 1
  Filled 2015-12-02: qty 2
  Filled 2015-12-02: qty 1

## 2015-12-02 MED ORDER — HYDRALAZINE HCL 20 MG/ML IJ SOLN: 5.0000 mg | INTRAMUSCULAR | Status: DC | PRN

## 2015-12-02 MED ORDER — FAMOTIDINE 20 MG PO TABS
20.0000 mg | ORAL_TABLET | Freq: Once | ORAL | Status: AC
  Administered 2015-12-02: 20 mg via ORAL
  Filled 2015-12-02: qty 1

## 2015-12-02 NOTE — ED Provider Notes (Signed)
CSN: [id number]     Arrival date & time 12/02/15  [id number] History   First MD Initiated Contact with Patient 12/02/15 [phone]     Chief Complaint  Patient presents with  . Chest Pain     (Consider location/radiation/quality/duration/timing/severity/associated sxs/prior Treatment) HPI   50 year old female with chest pain. Intermittent for the last 3 days. Patient notices primarily in the very early morning hours when she wakes up. Describes a burning sensation substernally. Improves during the day. She is also complaining of different and more severe pain in her left chest/breast. Painful to the touch. She works in Software engineer. 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 She has additional complaint of pain and numbness in her right upper extremity. This goes from approximately her right elbow down into her hand. Is not isolated to any particular dermatome. She denies any pain in her neck or exacerbation with neck movement or other movements. She feels like her strength is fine. This pain comes and goes too but does not seem be associated with the chest pains she is having.  Past Medical History  Diagnosis Date  . Deaf    Past Surgical History  Procedure Laterality Date  . Laparoscopic ovarian cystectomy     History reviewed. No pertinent family history. Social History  Substance Use Topics  . Smoking status: Never Smoker   . Smokeless tobacco: None  . Alcohol Use: No   OB History    Gravida Para Term Preterm AB TAB SAB Ectopic Multiple Living   2 2        2      Review of Systems  All systems reviewed and negative, other than as noted in HPI.   Allergies  Review of patient's allergies indicates no known allergies.  Home Medications   Prior to Admission medications   Medication Sig Start Date End Date Taking? Authorizing Provider  cyclobenzaprine (FLEXERIL) 10 MG tablet Take 10  mg by mouth 3 (three) times daily as needed for muscle spasms.  01/08/15   Historical Provider, MD  lisinopril (PRINIVIL,ZESTRIL) 20 MG tablet Take 20 mg by mouth daily. 01/05/15   Historical Provider, MD  naproxen (NAPROSYN) 500 MG tablet Take 500 mg by mouth at bedtime.  01/09/15   Historical Provider, MD   BP 121/105 mmHg  Pulse 76  Temp(Src) 98 F (36.7 C) (Oral)  Resp 18  SpO2 100% Physical Exam  Constitutional: She appears well-developed and well-nourished. No distress.  HENT:  Head: Normocephalic and atraumatic.  Eyes: Conjunctivae are normal. Right eye exhibits no discharge. Left eye exhibits no discharge.  Neck: Neck supple.  Cardiovascular: Normal rate, regular rhythm and normal heart sounds.  Exam reveals no gallop and no friction rub.   No murmur heard. Pulmonary/Chest: Effort normal and breath sounds normal. No respiratory distress. She exhibits tenderness.  Nurse present for breast exam. Normal in appearance. No concerning mass, lesions or skin changes. No axillary adenopathy. Does have very reproducible pain with deep palpation of her intercostal muscles in the left anterior third and fourth intercostal space.  Abdominal: Soft. She exhibits no distension. There is no tenderness.  Musculoskeletal: She exhibits no edema or tenderness.  Lower extremities symmetric as compared to each other. No calf tenderness. Negative Homan's. No palpable cords.   Neurological: She is alert.  Skin: Skin is warm and dry.  Psychiatric: She has a normal mood and affect. Her behavior  is normal. Thought content normal.  Nursing note and vitals reviewed.   ED Course  Procedures (including critical care time) Labs Review Labs Reviewed  CBC WITH DIFFERENTIAL/PLATELET - Abnormal; Notable for the following:    RDW 15.6 (*)    All other components within normal limits  BASIC METABOLIC PANEL - Abnormal; Notable for the following:    Potassium 3.4 (*)    Chloride 99 (*)    Creatinine, Ser 1.04  (*)    All other components within normal limits  TROPONIN I - Abnormal; Notable for the following:    Troponin I 0.05 (*)    All other components within normal limits    Imaging Review No results found. I have personally reviewed and evaluated these images and lab results as part of my medical decision-making.   EKG Interpretation   Date/Time:  Saturday December 02 2015 06:57:21 EDT Ventricular Rate:  72 PR Interval:  163 QRS Duration: 86 QT Interval:  403 QTC Calculation: 441 R Axis:   45 Text Interpretation:  Sinus rhythm Consider left ventricular hypertrophy  Baseline wander in lead(s) II III aVF No old tracing to compare Confirmed  by Leando  MD, Camia Dipinto ([id number]) on 12/02/2015 8:12:57 AM      MDM   Final diagnoses:  Chest pain, unspecified chest pain type  Right arm pain   8:07 AM 49yF with CP. Deaf and history is difficult with writing. Awaiting sign language interpreter. The best I can tell at this point though some features are somewhat concerning and some not so much. Pain is intermittent. Feels mostly in early morning hours while in bed. Burning. Thinks makes me this could potentially be GERD. Could explain pain in her back as well. I can't correlate R hand/forearm pain and numbness with this though. This is not consistent with GERD but could be with something like ACS. Consider dissection with some pain in back, upper extremity and vague neurological complaint. m not sure if they are necessarily all related. I don't appreciate a murmur on exam. UE and LE pulses feel symmetric to me.  EKG is pretty unremarkable. Some LVH and early repol. No comparison. CXR and labs pending.   9:30 AM Interpreter used. Much better history. Her chest pain really sounds like reflux. Burning. Sometimes some regurgitation. Worse at night. Feels more when leaning forward. She is having pain in L "breast." Her breast exam is unremarkable. The pain is very reproducible with deep palpation of  intercostals to point that she winces. The arm pain/numbness I am not as sure of. 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 This could be from strain/overuse but she has done this type of work for awhile now and I'm not sure why she would develop problems now. She denies other associated symptoms such as dyspnea, diaphoresis, worsening with exertion, etc.   Troponin is trivially elevated. I don't know what to make of this. Consider pericarditis/myocarditis? My sense is probably not. I really don't think this is ACS. I cannot overlook it though. Will repeat. If still elevated will discuss with cardiology. Will treat pain otherwise while repeat labs pending.   Trop unchanged but still minimally elevated. Discussed briefly with Dr Johnsie Cancel, cardiology. Can see in consultation. Will discuss with medicine for admission.   Virgel Manifold, MD 12/02/15 1225

## 2015-12-02 NOTE — ED Notes (Signed)
Pt from home with chest pain. Pt states it started Wednesday. Pt also c/o of left leg and right arm pain.

## 2015-12-02 NOTE — ED Notes (Signed)
Sign language interpreter at bedside  

## 2015-12-02 NOTE — H&P (Signed)
History and Physical    Sudiksha Rippy [id number] DOB: 10-02-1965 DOA: 12/02/2015  PCP: Agnes Lawrence, MD Patient coming from: home  Chief Complaint: chest pain at rest  HPI: Mellany Schlender is a 50 y.o. female with medical history significant of hypertension on lisinopril since in the emergency department with three-day history of intermittent substernal chest pain. Initial evaluation reveals minimally elevated troponin slightly concerning for ACS.  Information is obtained from the patient through an interpreter a she is deaf. She reports a three-day history of intermittent substernal chest pain. She describes the pain as a burning located substernal nonradiating worse after eating and at night.. She states "I feel better if I don't eat". She denies palpitations diaphoresis shortness of breath headache visual disturbances dizziness syncope or near-syncope. She denies lower extremity edema orthopnea. She does complain of several week history of right elbow and arm pain. 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 She denies any numbness or tingling of the right hand or weakness. She denies any cervical pain or injury. She denies dysuria hematuria frequency or urgency. She denies abdominal pain nausea vomiting diarrhea constipation melena bright red blood per rectum.    ED Course: She is provided with Pepcid and tramadol in the emergency department  Review of Systems: As per HPI otherwise 10 point review of systems negative.   Ambulatory Status: Independent  Past Medical History  Diagnosis Date  . Deaf   . Chest pain     Past Surgical History  Procedure Laterality Date  . Laparoscopic ovarian cystectomy      Social History   Social History  . Marital Status: Single    Spouse Name: N/A  . Number of Children: N/A  . Years of Education: N/A   Occupational History  . Not on file.   Social History Main Topics  . Smoking status: Never  Smoker   . Smokeless tobacco: Not on file  . Alcohol Use: No  . Drug Use: No  . Sexual Activity: Not Currently    Birth Control/ Protection: None   Other Topics Concern  . Not on file   Social History Narrative    No Known Allergies  History reviewed. No pertinent family history. Patient reports father lived to be 95 and that he did have heart disease. She is unaware if she has any other family and therefore does not know their medical history  Prior to Admission medications   Medication Sig Start Date End Date Taking? Authorizing Provider  cyclobenzaprine (FLEXERIL) 10 MG tablet Take 10 mg by mouth 3 (three) times daily as needed for muscle spasms.  01/08/15   Historical Provider, MD  lisinopril (PRINIVIL,ZESTRIL) 20 MG tablet Take 20 mg by mouth daily. 01/05/15   Historical Provider, MD  naproxen (NAPROSYN) 500 MG tablet Take 500 mg by mouth at bedtime.  01/09/15   Historical Provider, MD    Physical Exam: Filed Vitals:   12/02/15 0845 12/02/15 1000 12/02/15 1100 12/02/15 1221  BP: 157/97 157/99 157/102 181/117  Pulse: 56 51 58   Temp:      TempSrc:      Resp: 18 15 13    SpO2: 100% 100% 98%      General:  Appears calm and comfortable, thin Eyes:  PERRL, EOMI, normal lids, iris ENT:  grossly normal hearing, lips & tongue, mmm mucous membranes moist and pink Neck:  no LAD, masses or thyromegaly Cardiovascular:  RRR, no m/r/g. No LE edema.  Respiratory:  CTA bilaterally, no w/r/r. Normal respiratory effort. Abdomen:  soft, ntnd, NABS no guarding or rebounding Skin:  no rash or induration seen on limited exam Musculoskeletal:  grossly normal tone BUE/BLE, good ROM, no bony abnormality Psychiatric:  grossly normal mood and affect, speech fluent and appropriate, AOx3 Neurologic:  CN 2-12 grossly intact, moves all extremities in coordinated fashion, sensation intact  Labs on Admission: I have personally reviewed following labs and imaging studies  CBC:  Recent Labs Lab  12/02/15 0750  WBC 7.1  NEUTROABS 4.9  HGB 13.7  HCT 43.2  MCV 85.5  PLT A999333   Basic Metabolic Panel:  Recent Labs Lab 12/02/15 0750  NA 137  K 3.4*  CL 99*  CO2 28  GLUCOSE 66  BUN 14  CREATININE 1.04*  CALCIUM 9.5   GFR: CrCl cannot be calculated (Unknown ideal weight.). Liver Function Tests: No results for input(s): AST, ALT, ALKPHOS, BILITOT, PROT, ALBUMIN in the last 168 hours. No results for input(s): LIPASE, AMYLASE in the last 168 hours. No results for input(s): AMMONIA in the last 168 hours. Coagulation Profile: No results for input(s): INR, PROTIME in the last 168 hours. Cardiac Enzymes:  Recent Labs Lab 12/02/15 0750 12/02/15 1001  TROPONINI 0.05* 0.05*   BNP (last 3 results) No results for input(s): PROBNP in the last 8760 hours. HbA1C: No results for input(s): HGBA1C in the last 72 hours. CBG: No results for input(s): GLUCAP in the last 168 hours. Lipid Profile: No results for input(s): CHOL, HDL, LDLCALC, TRIG, CHOLHDL, LDLDIRECT in the last 72 hours. Thyroid Function Tests: No results for input(s): TSH, T4TOTAL, FREET4, T3FREE, THYROIDAB in the last 72 hours. Anemia Panel: No results for input(s): VITAMINB12, FOLATE, FERRITIN, TIBC, IRON, RETICCTPCT in the last 72 hours. Urine analysis:    Component Value Date/Time   COLORURINE YELLOW 06/02/2013 1525   APPEARANCEUR CLOUDY* 06/02/2013 1525   LABSPEC 1.019 06/02/2013 1525   PHURINE 7.0 06/02/2013 1525   GLUCOSEU NEGATIVE 06/02/2013 1525   HGBUR NEGATIVE 06/02/2013 [place] 06/02/2013 1525   KETONESUR NEGATIVE 06/02/2013 1525   PROTEINUR NEGATIVE 06/02/2013 1525   UROBILINOGEN 0.2 06/02/2013 1525   NITRITE NEGATIVE 06/02/2013 1525   LEUKOCYTESUR NEGATIVE 06/02/2013 1525    Creatinine Clearance: CrCl cannot be calculated (Unknown ideal weight.).  Sepsis Labs: @LABRCNTIP (procalcitonin:4,lacticidven:4) )No results found for this or any previous visit (from the past  240 hour(s)).   Radiological Exams on Admission: Dg Chest 2 View  12/02/2015  CLINICAL DATA:  Substernal chest pain. Shortness of breath. Dry cough. EXAM: CHEST  2 VIEW COMPARISON:  None. FINDINGS: Linear densities at both lung bases favoring subsegmental atelectasis or scarring, although atypical bronchiolitis might cause a similar appearance. The lungs appear otherwise clear. Cardiac and mediastinal margins appear normal. No pleural effusion. IMPRESSION: 1. Faint linear densities at the lung bases favoring subsegmental atelectasis or scarring, atypical infectious process not entirely excluded. Electronically Signed   By: Van Clines M.D.   On: 12/02/2015 08:31    EKG: Independently reviewed. Sinus rhythm Left ventricular hypertrophy ST elev, probable normal early repol pattern No significant change since last tracing   Assessment/Plan Principal Problem:   Chest pain at rest Active Problems:   HTN (hypertension)   Hypokalemia   Elevated troponin   Right arm pain   #1. Chest pain at rest. Likely GERD and nsaid use as pain increases with eating, spicy food and lying down. Recently started naprosyn for right arm pain.  Heart  score 3. Risk factors hypertension family history. Minimally elevated troponin. EKG sinus rhythm with LVH -Admit to telemetry -Cycle troponin -Serial EKGs -GI cocktail -Protonix -Pain management -discontinue nsaid -Cardiology consult requested per ED M.D. -will likely need OP GI follow up  #2. Hypertension. Poor control in the emergency department. Home medications include lisinopril. Patient reports having taken her medications today. -We'll resume home meds -prn hydralazine -monitor  #3. Elevated troponin. See #1  4. Right arm pain. Likely musculoskeletal. No numbness tingling or weakness. Sensation intact -discontinue Naprosyn -discontinue  Flexeril -provide protonix     DVT prophylaxis: lovenox  Code Status: full  Family Communication: son  at bedside  Disposition Plan: home   Consults called: nishan with cards  Admission status: obs    Dyanne Carrel M MD [facility]  If 7PM-7AM, please contact night-coverage [email or web address] Password [facility]  12/02/2015, 12:36 PM

## 2015-12-02 NOTE — ED Notes (Signed)
Karene Fry, Sign Language Interpreter will arrive in 30 - 45 mins.

## 2015-12-02 NOTE — ED Notes (Signed)
Sign Language Interpreter - Maddie, called back regarding an interpreter. States she will call back when one asap.

## 2015-12-03 DIAGNOSIS — R079 Chest pain, unspecified: Secondary | ICD-10-CM | POA: Diagnosis not present

## 2015-12-03 DIAGNOSIS — E876 Hypokalemia: Secondary | ICD-10-CM | POA: Diagnosis not present

## 2015-12-03 DIAGNOSIS — R072 Precordial pain: Secondary | ICD-10-CM | POA: Diagnosis not present

## 2015-12-03 DIAGNOSIS — I10 Essential (primary) hypertension: Secondary | ICD-10-CM | POA: Diagnosis not present

## 2015-12-03 DIAGNOSIS — R7989 Other specified abnormal findings of blood chemistry: Secondary | ICD-10-CM

## 2015-12-03 LAB — BASIC METABOLIC PANEL
ANION GAP: 5 (ref 5–15)
BUN: 21 mg/dL — ABNORMAL HIGH (ref 6–20)
CALCIUM: 9.2 mg/dL (ref 8.9–10.3)
CO2: 27 mmol/L (ref 22–32)
Chloride: 102 mmol/L (ref 101–111)
Creatinine, Ser: 1.21 mg/dL — ABNORMAL HIGH (ref 0.44–1.00)
GFR, EST AFRICAN AMERICAN: 59 mL/min — AB (ref >60)
GFR, EST NON AFRICAN AMERICAN: 51 mL/min — AB (ref >60)
Glucose, Bld: 101 mg/dL — ABNORMAL HIGH (ref 65–99)
Potassium: 4.1 mmol/L (ref 3.5–5.1)
SODIUM: 134 mmol/L — AB (ref 135–145)

## 2015-12-03 LAB — TROPONIN I: TROPONIN I: 0.04 ng/mL — AB (ref <0.031)

## 2015-12-03 NOTE — Progress Notes (Addendum)
PROGRESS NOTE    Tina Gray  [id number] DOB: 1965/09/10 DOA: 12/02/2015  PCP: Agnes Lawrence, MD   Brief Narrative:  50 y.o. female with medical history significant for hypertension who presented to [facility] ED with chest pain, substernal, burning, non radiating, worse after eating and at night.   She was hemodynamically stable. Her troponin level was mildly elevated at 0.04. The 12 lead EKG showed sinus rhythm. She was admitted for chest pain evaluation.  Assessment & Plan:   Principal Problem:   Chest pain at rest / Mild troponin elveation - Minimally elevated troponin level likely demand ischemia from elevated creatinine - No CP this am - The 12 lead EKG showed sinus rhythm  - 2 D ECHO is pending, cardio recommended following up on echo results and if abnormal to get official consultation   Active Problems:   Acute renal failure / Hypokalemia - Likely due to Hctz - Placed on hold - Check BMP in am      Essential hypertension - BP meds on hold since BP on soft side    DVT prophylaxis: SCD's bilaterally  Code Status: full code  Family Communication: no family at the bedside this am  Disposition Plan: home once results of 2 D ECHO available    Consultants:   None   Procedures:   2 D ECHO - pending   Antimicrobials:   None     Subjective: No chest pain.  Objective: Filed Vitals:   12/02/15 2130 12/03/15 0512 12/03/15 1525 12/03/15 2058  BP: 97/61 113/67 104/53 109/64  Pulse: 49 47 51 54  Temp: 97.9 F (36.6 C) 97.9 F (36.6 C) 98.6 F (37 C) 98.4 F (36.9 C)  TempSrc: Oral Oral Oral Oral  Resp: 17 18 17 16   Weight:  56.11 kg (123 lb 11.2 oz)    SpO2: 99% 100% 99% 97%   No intake or output data in the 24 hours ending 12/03/15 2216 Filed Weights   12/03/15 0512  Weight: 56.11 kg (123 lb 11.2 oz)    Examination:  General exam: Appears calm and comfortable  Respiratory system: Clear to auscultation. Respiratory effort  normal. Cardiovascular system: S1 & S2 heard, RRR. No JVD, murmurs, rubs, gallops or clicks. No pedal edema. Gastrointestinal system: Abdomen is nondistended, soft and nontender. No organomegaly or masses felt. Normal bowel sounds heard. Central nervous system: Alert and oriented. No focal neurological deficits. Extremities: Symmetric 5 x 5 power. Skin: No rashes, lesions or ulcers Psychiatry: Judgement and insight appear normal. Mood & affect appropriate.   Data Reviewed: I have personally reviewed following labs and imaging studies  CBC:  Recent Labs Lab 12/02/15 0750  WBC 7.1  NEUTROABS 4.9  HGB 13.7  HCT 43.2  MCV 85.5  PLT A999333   Basic Metabolic Panel:  Recent Labs Lab 12/02/15 0750 12/03/15 0109  NA 137 134*  K 3.4* 4.1  CL 99* 102  CO2 28 27  GLUCOSE 66 101*  BUN 14 21*  CREATININE 1.04* 1.21*  CALCIUM 9.5 9.2   GFR: CrCl cannot be calculated (Unknown ideal weight.). Liver Function Tests: No results for input(s): AST, ALT, ALKPHOS, BILITOT, PROT, ALBUMIN in the last 168 hours. No results for input(s): LIPASE, AMYLASE in the last 168 hours. No results for input(s): AMMONIA in the last 168 hours. Coagulation Profile: No results for input(s): INR, PROTIME in the last 168 hours. Cardiac Enzymes:  Recent Labs Lab 12/02/15 0750 12/02/15 1001 12/02/15 1408 12/02/15 2106 12/03/15 0109  TROPONINI 0.05*  0.05* 0.05* 0.04* 0.04*   BNP (last 3 results) No results for input(s): PROBNP in the last 8760 hours. HbA1C: No results for input(s): HGBA1C in the last 72 hours. CBG: No results for input(s): GLUCAP in the last 168 hours. Lipid Profile: No results for input(s): CHOL, HDL, LDLCALC, TRIG, CHOLHDL, LDLDIRECT in the last 72 hours. Thyroid Function Tests:  Recent Labs  12/02/15 1408  TSH 0.601   Anemia Panel: No results for input(s): VITAMINB12, FOLATE, FERRITIN, TIBC, IRON, RETICCTPCT in the last 72 hours. Urine analysis:    Component Value  Date/Time   COLORURINE YELLOW 06/02/2013 1525   APPEARANCEUR CLOUDY* 06/02/2013 1525   LABSPEC 1.019 06/02/2013 1525   PHURINE 7.0 06/02/2013 1525   GLUCOSEU NEGATIVE 06/02/2013 1525   HGBUR NEGATIVE 06/02/2013 [place] 06/02/2013 1525   KETONESUR NEGATIVE 06/02/2013 1525   PROTEINUR NEGATIVE 06/02/2013 1525   UROBILINOGEN 0.2 06/02/2013 1525   NITRITE NEGATIVE 06/02/2013 1525   LEUKOCYTESUR NEGATIVE 06/02/2013 1525   Sepsis Labs: @LABRCNTIP (procalcitonin:4,lacticidven:4)   )No results found for this or any previous visit (from the past 240 hour(s)).    Radiology Studies: Dg Chest 2 View 12/02/2015   1. Faint linear densities at the lung bases favoring subsegmental atelectasis or scarring, atypical infectious process not entirely excluded.     Scheduled Meds: . pantoprazole  40 mg Oral BID AC  . potassium chloride  40 mEq Oral BID  . sodium chloride flush  3 mL Intravenous Q12H   Continuous Infusions:       Time spent: 15 minutes  Greater than 50% of the time spent on counseling and coordinating the care.   Leisa Lenz, MD [facility] Pager [phone]  If 7PM-7AM, please contact night-coverage [email or web address] Password [email or web address] 12/03/2015, 10:16 PM

## 2015-12-04 ENCOUNTER — Observation Stay (HOSPITAL_BASED_OUTPATIENT_CLINIC_OR_DEPARTMENT_OTHER): Payer: BLUE CROSS/BLUE SHIELD

## 2015-12-04 DIAGNOSIS — R072 Precordial pain: Secondary | ICD-10-CM | POA: Diagnosis not present

## 2015-12-04 DIAGNOSIS — R7989 Other specified abnormal findings of blood chemistry: Secondary | ICD-10-CM | POA: Diagnosis not present

## 2015-12-04 DIAGNOSIS — R079 Chest pain, unspecified: Secondary | ICD-10-CM | POA: Diagnosis not present

## 2015-12-04 DIAGNOSIS — I10 Essential (primary) hypertension: Secondary | ICD-10-CM | POA: Diagnosis not present

## 2015-12-04 LAB — BASIC METABOLIC PANEL
ANION GAP: 5 (ref 5–15)
BUN: 14 mg/dL (ref 6–20)
CALCIUM: 9 mg/dL (ref 8.9–10.3)
CO2: 27 mmol/L (ref 22–32)
Chloride: 104 mmol/L (ref 101–111)
Creatinine, Ser: 0.96 mg/dL (ref 0.44–1.00)
GLUCOSE: 87 mg/dL (ref 65–99)
POTASSIUM: 4.5 mmol/L (ref 3.5–5.1)
SODIUM: 136 mmol/L (ref 135–145)

## 2015-12-04 LAB — ECHOCARDIOGRAM COMPLETE
CHL CUP MV DEC (S): 155
E decel time: 155 msec
FS: 33 % (ref 28–44)
IVS/LV PW RATIO, ED: 1
LA ID, A-P, ES: 36 mm
LA vol A4C: 46 ml
LA vol: 64 mL
LADIAMINDEX: 2.25 cm/m2
LAVOLIN: 40 mL/m2
LDCA: 2.54 cm2
LEFT ATRIUM END SYS DIAM: 36 mm
LV PW d: 9 mm — AB (ref 0.6–1.1)
LVOT diameter: 18 mm
MV pk E vel: 1.4 m/s
Weight: 1944 oz

## 2015-12-04 NOTE — Progress Notes (Signed)
12/04/2015 1445 Pt. And family wanted results of ECHO before leaving, Dr. Charlies Silvers asked if I would give pt copy of results.   Carney Corners

## 2015-12-04 NOTE — Discharge Summary (Signed)
Physician Discharge Summary  Tina Gray [id number] DOB: 01-20-1966 DOA: 12/02/2015  PCP: Agnes Lawrence, MD  Admit date: 12/02/2015 Discharge date: 12/04/2015  Recommendations for Outpatient Follow-up:  1. No changes in medications on discharge. Follow-up with primary care physician in one to 2 weeks after discharge to make sure symptoms are controlled.  Discharge Diagnoses:  Principal Problem:   Chest pain at rest Active Problems:   HTN (hypertension)   Hypokalemia   Elevated troponin   Right arm pain    Discharge Condition: stable   Diet recommendation: as tolerated   History of present illness:  50 y.o. female with medical history significant for hypertension who presented to [facility] ED with chest pain, substernal, burning, non radiating, worse after eating and at night.   She was hemodynamically stable. Her troponin level was mildly elevated at 0.04. The 12 lead EKG showed sinus rhythm. She was admitted for chest pain evaluation.  Hospital Course:    Assessment & Plan:  Principal Problem:  Chest pain at rest / Mild troponin elveation - Minimally elevated troponin level likely demand ischemia from elevated creatinine - No CP  - The 12 lead EKG showed sinus rhythm  - 2 D ECHO is pending, will update results after results are available   Active Problems:  Acute renal failure / Hypokalemia - Likely due to Hctz - Placed on hold - Check BMP in am    Essential hypertension - BP controlled, may resume Hctz on discharge    DVT prophylaxis: SCD's bilaterally  Code Status: full code  Family Communication: no family at the bedside this am; spoke with her husband over the phone and face time so he can interpret plan of care     Consultants:   None  Procedures:   2 D ECHO - pending  Antimicrobials:   None    Signed:  Leisa Lenz, MD  [facility] 12/04/2015, 10:55 AM  Pager #: [phone]  Time spent in minutes: less than 30  minutes   Discharge Exam: Filed Vitals:   12/03/15 2058 12/04/15 0439  BP: 109/64 122/69  Pulse: 54 58  Temp: 98.4 F (36.9 C) 97.7 F (36.5 C)  Resp: 16 17   Filed Vitals:   12/03/15 0512 12/03/15 1525 12/03/15 2058 12/04/15 0439  BP: 113/67 104/53 109/64 122/69  Pulse: 47 51 54 58  Temp: 97.9 F (36.6 C) 98.6 F (37 C) 98.4 F (36.9 C) 97.7 F (36.5 C)  TempSrc: Oral Oral Oral Oral  Resp: 18 17 16 17   Weight: 56.11 kg (123 lb 11.2 oz)   55.112 kg (121 lb 8 oz)  SpO2: 100% 99% 97% 100%    General: Pt is alert, follows commands appropriately, not in acute distress Cardiovascular: Regular rate and rhythm, S1/S2 +, no murmurs Respiratory: Clear to auscultation bilaterally, no wheezing, no crackles, no rhonchi Abdominal: Soft, non tender, non distended, bowel sounds +, no guarding Extremities: no edema, no cyanosis, pulses palpable bilaterally DP and PT Neuro: Grossly nonfocal  Discharge Instructions  Discharge Instructions    Call MD for:  difficulty breathing, headache or visual disturbances    Complete by:  As directed      Call MD for:  persistant dizziness or light-headedness    Complete by:  As directed      Call MD for:  persistant nausea and vomiting    Complete by:  As directed      Call MD for:  severe uncontrolled pain    Complete by:  As directed      Diet - low sodium heart healthy    Complete by:  As directed      Increase activity slowly    Complete by:  As directed             Medication List    TAKE these medications        BC HEADACHE POWDER PO  Take 1 packet by mouth every 8 (eight) hours as needed (migraine).     hydrochlorothiazide 25 MG tablet  Commonly known as:  HYDRODIURIL  Take 25 mg by mouth daily.     sucralfate 1 g tablet  Commonly known as:  CARAFATE  Take 1 g by mouth 4 (four) times daily.           Follow-up Information    Follow up with Lahoma Crocker A, MD. Schedule an appointment as soon as possible for a  visit in 1 week.   Specialty:  Obstetrics and Gynecology   Why:  Follow up appt after recent hospitalization   Contact information:   [place] [address] [phone]        The results of significant diagnostics from this hospitalization (including imaging, microbiology, ancillary and laboratory) are listed below for reference.    Significant Diagnostic Studies: Dg Chest 2 View  12/02/2015  CLINICAL DATA:  Substernal chest pain. Shortness of breath. Dry cough. EXAM: CHEST  2 VIEW COMPARISON:  None. FINDINGS: Linear densities at both lung bases favoring subsegmental atelectasis or scarring, although atypical bronchiolitis might cause a similar appearance. The lungs appear otherwise clear. Cardiac and mediastinal margins appear normal. No pleural effusion. IMPRESSION: 1. Faint linear densities at the lung bases favoring subsegmental atelectasis or scarring, atypical infectious process not entirely excluded. Electronically Signed   By: Van Clines M.D.   On: 12/02/2015 08:31    Microbiology: No results found for this or any previous visit (from the past 240 hour(s)).   Labs: Basic Metabolic Panel:  Recent Labs Lab 12/02/15 0750 12/03/15 0109 12/04/15 0241  NA 137 134* 136  K 3.4* 4.1 4.5  CL 99* 102 104  CO2 28 27 27   GLUCOSE 66 101* 87  BUN 14 21* 14  CREATININE 1.04* 1.21* 0.96  CALCIUM 9.5 9.2 9.0   Liver Function Tests: No results for input(s): AST, ALT, ALKPHOS, BILITOT, PROT, ALBUMIN in the last 168 hours. No results for input(s): LIPASE, AMYLASE in the last 168 hours. No results for input(s): AMMONIA in the last 168 hours. CBC:  Recent Labs Lab 12/02/15 0750  WBC 7.1  NEUTROABS 4.9  HGB 13.7  HCT 43.2  MCV 85.5  PLT 210   Cardiac Enzymes:  Recent Labs Lab 12/02/15 0750 12/02/15 1001 12/02/15 1408 12/02/15 2106 12/03/15 0109  TROPONINI 0.05* 0.05* 0.05* 0.04* 0.04*   BNP: BNP (last 3 results) No results for input(s):  BNP in the last 8760 hours.  ProBNP (last 3 results) No results for input(s): PROBNP in the last 8760 hours.  CBG: No results for input(s): GLUCAP in the last 168 hours.

## 2015-12-04 NOTE — Progress Notes (Signed)
12/04/2015 3:27 PM Discharge AVS meds taken today and those due this evening reviewed.  Echo report given to pt.  Encouraged pt and son to take Echo report to follow-up appt with primary care MD so he could go over the results.  Follow-up appointments and when to call md reviewed.  D/C IV and TELE.  Questions and concerns addressed.   D/C home per orders. Carney Corners

## 2015-12-04 NOTE — Discharge Instructions (Signed)

## 2015-12-04 NOTE — Progress Notes (Signed)
  Echocardiogram 2D Echocardiogram has been performed.  Tina Gray 12/04/2015, 12:27 PM

## 2015-12-07 DIAGNOSIS — M47812 Spondylosis without myelopathy or radiculopathy, cervical region: Secondary | ICD-10-CM | POA: Diagnosis not present

## 2015-12-14 ENCOUNTER — Encounter: Payer: Self-pay | Admitting: Gastroenterology

## 2016-02-15 ENCOUNTER — Encounter: Payer: Self-pay | Admitting: Gastroenterology

## 2016-02-15 ENCOUNTER — Ambulatory Visit (INDEPENDENT_AMBULATORY_CARE_PROVIDER_SITE_OTHER): Payer: Medicare Other | Admitting: Gastroenterology

## 2016-02-15 VITALS — BP 117/78 | HR 78 | Ht 65.5 in | Wt 123.2 lb

## 2016-02-15 DIAGNOSIS — R634 Abnormal weight loss: Secondary | ICD-10-CM | POA: Diagnosis not present

## 2016-02-15 DIAGNOSIS — R1310 Dysphagia, unspecified: Secondary | ICD-10-CM | POA: Diagnosis not present

## 2016-02-15 DIAGNOSIS — K219 Gastro-esophageal reflux disease without esophagitis: Secondary | ICD-10-CM | POA: Diagnosis not present

## 2016-02-15 NOTE — Patient Instructions (Signed)
If you are age 52 or older, your body mass index should be between 23-30. Your Body mass index is 20.2 kg/m. If this is out of the aforementioned range listed, please consider follow up with your Primary Care Provider.  If you are age 7 or younger, your body mass index should be between 19-25. Your Body mass index is 20.2 kg/m. If this is out of the aformentioned range listed, please consider follow up with your Primary Care Provider.   You have been scheduled for an endoscopy. Please follow written instructions given to you at your visit today. If you use inhalers (even only as needed), please bring them with you on the day of your procedure. Your physician has requested that you go to www.startemmi.com and enter the access code given to you at your visit today. This web site gives a general overview about your procedure. However, you should still follow specific instructions given to you by our office regarding your preparation for the procedure.  Thank you for choosing [facility]  Dr Wilfrid Lund III

## 2016-02-15 NOTE — Progress Notes (Signed)
[facility] Consult Note:  History: Tina Gray 02/15/2016  Referring physician: Agnes Lawrence, MD  Reason for consult/chief complaint: Gastroesophageal Reflux (has had for a long time. med not helping.-Omeprazole)   Subjective  HPI:  Tina Gray was sent by primary care to evaluate chronic dysphagia and symptoms that are felt likely to be GERD. I also reviewed discharge summary from a June 2017 hospital stay for chest pain. It was felt likely to be GERD in etiology, thus she was sent home on a PPI and Carafate. Tina Gray was seen with the aid of a sign language interpreter today. She describes at least several years of a somewhat vague chest pressure after eating and either feelings of food not passing well into the stomach or perhaps regurgitating later on. It seems that cold or hot liquids might give this discomfort as well. At times that pressure makes her feel short of breath and anxious as well. In the morning she often brings up liquid and phlegm, but it does not sound like she regurgitates undigested food. Most bothersome has been at least a 5 pound weight loss in the last few months, which she attributes to the symptoms. She has not developed any compensatory mechanisms such as certain positions to aid in swallowing. She has had no appreciable improvement on medicines thus far. ROS:  Review of Systems  Constitutional: Negative for appetite change and unexpected weight change.  HENT: Negative for mouth sores and voice change.   Eyes: Negative for pain and redness.  Respiratory: Negative for cough and shortness of breath.   Cardiovascular: Negative for chest pain and palpitations.  Genitourinary: Negative for dysuria and hematuria.  Musculoskeletal: Positive for arthralgias. Negative for myalgias.       Bilateral wrist pain from suspected carpal tunnel.  Also intermittent left breast pain which she reports was evaluated by an exam at primary care and was sounds like  perhaps a sonogram.  Skin: Negative for pallor and rash.  Neurological: Negative for weakness and headaches.  Hematological: Negative for adenopathy.     Past Medical History: Past Medical History:  Diagnosis Date  . Carpal tunnel syndrome   . Chest pain   . Deaf   . GERD (gastroesophageal reflux disease)   . Hypertension      Past Surgical History: Past Surgical History:  Procedure Laterality Date  . LAPAROSCOPIC OVARIAN CYSTECTOMY       Family History: Family History  Problem Relation Age of Onset  . Heart attack Mother   . Heart disease Mother   . Cancer Father     Social History: Social History   Social History  . Marital status: Single    Spouse name: N/A  . Number of children: 2  . Years of education: N/A   Occupational History  . stocker/housekeeper    Social History Main Topics  . Smoking status: Never Smoker  . Smokeless tobacco: Never Used  . Alcohol use No  . Drug use: No  . Sexual activity: Yes    Birth control/ protection: None   Other Topics Concern  . None   Social History Narrative  . None  Originally from [place]  Allergies: No Known Allergies  Outpatient Meds: Current Outpatient Prescriptions  Medication Sig Dispense Refill  . Aspirin-Salicylamide-Caffeine (BC HEADACHE POWDER PO) Take 1 packet by mouth every 8 (eight) hours as needed (migraine).    . hydrochlorothiazide (HYDRODIURIL) 25 MG tablet Take 25 mg by mouth daily.    . meloxicam (MOBIC) 15 MG tablet  Take 15 mg by mouth daily.    Marland Kitchen omeprazole (PRILOSEC) 40 MG capsule Take 40 mg by mouth daily.    . sucralfate (CARAFATE) 1 g tablet Take 1 g by mouth 4 (four) times daily.     No current facility-administered medications for this visit.       ___________________________________________________________________ Objective   Exam:  BP 117/78   Pulse 78   Ht 5' 5.5" (1.664 m)   Wt 123 lb 4 oz (55.9 kg)   BMI 20.20 kg/m    General: this is a(n) Thin  well-appearing middle-aged woman   Eyes: sclera anicteric, no redness  ENT: oral mucosa moist without lesions, no cervical, axillary or supraclavicular lymphadenopathy, good dentition  CV: RRR without murmur, S1/S2, no JVD, no peripheral edema  Resp: clear to auscultation bilaterally, normal RR and effort noted  GI: soft, no tenderness, with active bowel sounds. No guarding or palpable organomegaly noted.  Skin; warm and dry, no rash or jaundice noted  Neuro: awake, alert and oriented x 3. Normal gross motor function and fluent speech The ASL interpreter was present for the entire encounter  Labs: CBC    Component Value Date/Time   WBC 7.1 12/02/2015 0750   RBC 5.05 12/02/2015 0750   HGB 13.7 12/02/2015 0750   HCT 43.2 12/02/2015 0750   PLT 210 12/02/2015 0750   MCV 85.5 12/02/2015 0750   MCH 27.1 12/02/2015 0750   MCHC 31.7 12/02/2015 0750   RDW 15.6 (H) 12/02/2015 0750   LYMPHSABS 1.4 12/02/2015 0750   MONOABS 0.5 12/02/2015 0750   EOSABS 0.2 12/02/2015 0750   BASOSABS 0.0 12/02/2015 0750     Radiologic Studies:  Nonacute 2 view chest x-ray 12/02/2015  Assessment: Encounter Diagnoses  Name Primary?  . Gastroesophageal reflux disease, esophagitis presence not specified Yes  . Dysphagia   . Abnormal loss of weight     Difficult to tell if GERD or motility, maybe achalasia.  Plan:   EGDWith possible dilation. She is agreeable after thorough discussion the procedure and risks. All questions were answered.  The benefits and risks of the planned procedure were described in detail with the patient or (when appropriate) their health care proxy.  Risks were outlined as including, but not limited to, bleeding, infection, perforation, adverse medication reaction leading to cardiac or pulmonary decompensation, or pancreatitis (if ERCP).  The limitation of incomplete mucosal visualization was also discussed.  No guarantees or warranties were given.  Eventual screening  colonoscopy  Thank you for the courtesy of this consult.  Please call me with any questions or concerns.  Nelida Meuse III  CC: Agnes Lawrence, MD

## 2016-02-20 DIAGNOSIS — H913 Deaf nonspeaking, not elsewhere classified: Secondary | ICD-10-CM | POA: Diagnosis not present

## 2016-02-20 DIAGNOSIS — G5600 Carpal tunnel syndrome, unspecified upper limb: Secondary | ICD-10-CM | POA: Diagnosis not present

## 2016-02-20 DIAGNOSIS — I10 Essential (primary) hypertension: Secondary | ICD-10-CM | POA: Diagnosis not present

## 2016-02-20 DIAGNOSIS — K219 Gastro-esophageal reflux disease without esophagitis: Secondary | ICD-10-CM | POA: Diagnosis not present

## 2016-03-06 ENCOUNTER — Ambulatory Visit (AMBULATORY_SURGERY_CENTER): Payer: Medicare Other | Admitting: Gastroenterology

## 2016-03-06 ENCOUNTER — Encounter: Payer: Self-pay | Admitting: Gastroenterology

## 2016-03-06 ENCOUNTER — Other Ambulatory Visit: Payer: Self-pay

## 2016-03-06 VITALS — BP 116/81 | HR 58 | Temp 97.3°F | Resp 45 | Ht 65.0 in | Wt 123.0 lb

## 2016-03-06 DIAGNOSIS — I10 Essential (primary) hypertension: Secondary | ICD-10-CM | POA: Diagnosis not present

## 2016-03-06 DIAGNOSIS — K219 Gastro-esophageal reflux disease without esophagitis: Secondary | ICD-10-CM

## 2016-03-06 MED ORDER — SODIUM CHLORIDE 0.9 % IV SOLN: 500.0000 mL | INTRAVENOUS | Status: DC

## 2016-03-06 NOTE — Op Note (Signed)
[facility] Patient Name: Manju Shuford Procedure Date: 03/06/2016 10:10 AM MRN: [id number] Endoscopist: Bettles. Loletha Carrow , MD Age: 50 Referring MD:  Date of Birth: 02-23-1966 Gender: Female Account #: [id number] Procedure:                Upper GI endoscopy Indications:              Dysphagia, Chest pain (non cardiac), Weight loss Medicines:                Monitored Anesthesia Care Procedure:                Pre-Anesthesia Assessment:                           - Prior to the procedure, a History and Physical                            was performed, and patient medications and                            allergies were reviewed. The patient's tolerance of                            previous anesthesia was also reviewed. The risks                            and benefits of the procedure and the sedation                            options and risks were discussed with the patient.                            All questions were answered, and informed consent                            was obtained. Prior Anticoagulants: The patient has                            taken no previous anticoagulant or antiplatelet                            agents. ASA Grade Assessment: II - A patient with                            mild systemic disease. After reviewing the risks                            and benefits, the patient was deemed in                            satisfactory condition to undergo the procedure.                           After obtaining informed consent, the endoscope was  passed under direct vision. Throughout the                            procedure, the patient's blood pressure, pulse, and                            oxygen saturations were monitored continuously. The                            Model GIF-[id number]) scope was introduced                            through the mouth, and advanced to the second part                            of  duodenum. The upper GI endoscopy was                            accomplished without difficulty. The patient                            tolerated the procedure well. Scope In: Scope Out: Findings:                 The esophagus was normal. Specifically, there was                            no mass or stricture;no dilatation; no resistance                            passing scope through EGJ. Motility was seen.                           The stomach was normal.                           The cardia and gastric fundus were normal on                            retroflexion.                           The examined duodenum was normal. Complications:            No immediate complications. Estimated Blood Loss:     Estimated blood loss: none. Impression:               - Normal esophagus.                           - Normal stomach.                           - Normal examined duodenum.                           - No specimens collected. Recommendation:           -  Patient has a contact number available for                            emergencies. The signs and symptoms of potential                            delayed complications were discussed with the                            patient. Return to normal activities tomorrow.                            Written discharge instructions were provided to the                            patient.                           - Resume previous diet.                           - Continue present medications.                           - Perform ambulatory esophageal manometry at the                            next available appointment.                           - Return to primary care physician at the next                            available appointment to investigate possible                            respiratory causes of patient's reported chest                            congestion and sputum production as well as weight                             loss. Landri Dorsainvil L. Loletha Carrow, MD 03/06/2016 10:36:47 AM This report has been signed electronically.

## 2016-03-06 NOTE — Progress Notes (Signed)
Spoke with Dr. Loletha Carrow assistant, Vivien Rota.  I gave her number to contact pt's son When she schedules manometry.  [phone].

## 2016-03-06 NOTE — Patient Instructions (Signed)
YOU HAD AN ENDOSCOPIC PROCEDURE TODAY AT [facility]:   Refer to the procedure report that was given to you for any specific questions about what was found during the examination.  If the procedure report does not answer your questions, please call your gastroenterologist to clarify.  If you requested that your care partner not be given the details of your procedure findings, then the procedure report has been included in a sealed envelope for you to review at your convenience later.  YOU SHOULD EXPECT: Some feelings of bloating in the abdomen. Passage of more gas than usual.  Walking can help get rid of the air that was put into your GI tract during the procedure and reduce the bloating. If you had a lower endoscopy (such as a colonoscopy or flexible sigmoidoscopy) you may notice spotting of blood in your stool or on the toilet paper. If you underwent a bowel prep for your procedure, you may not have a normal bowel movement for a few days.  Please Note:  You might notice some irritation and congestion in your nose or some drainage.  This is from the oxygen used during your procedure.  There is no need for concern and it should clear up in a day or so.  SYMPTOMS TO REPORT IMMEDIATELY:   Following lower endoscopy (colonoscopy or flexible sigmoidoscopy):  Excessive amounts of blood in the stool  Significant tenderness or worsening of abdominal pains  Swelling of the abdomen that is new, acute  Fever of 100F or higher   Following upper endoscopy (EGD)  Vomiting of blood or coffee ground material  New chest pain or pain under the shoulder blades  Painful or persistently difficult swallowing  New shortness of breath  Fever of 100F or higher  Black, tarry-looking stools  For urgent or emergent issues, a gastroenterologist can be reached at any hour by calling [phone].   DIET:  We do recommend a small meal at first, but then you may proceed to your regular diet.  Drink  plenty of fluids but you should avoid alcoholic beverages for 24 hours.  ACTIVITY:  You should plan to take it easy for the rest of today and you should NOT DRIVE or use heavy machinery until tomorrow (because of the sedation medicines used during the test).    FOLLOW UP: Our staff will call the number listed on your records the next business day following your procedure to check on you and address any questions or concerns that you may have regarding the information given to you following your procedure. If we do not reach you, we will leave a message.  However, if you are feeling well and you are not experiencing any problems, there is no need to return our call.  We will assume that you have returned to your regular daily activities without incident.  If any biopsies were taken you will be contacted by phone or by letter within the next 1-3 weeks.  Please call us at [phone] if you have not heard about the biopsies in 3 weeks.    SIGNATURES/CONFIDENTIALITY: You and/or your care partner have signed paperwork which will be entered into your electronic medical record.  These signatures attest to the fact that that the information above on your After Visit Summary has been reviewed and is understood.  Full responsibility of the confidentiality of this discharge information lies with you and/or your care-partner.  Manometry to be scheduled  See primary care doctor to investigate  respiratory causes of chest congestion, sputum production, and weight loss.

## 2016-03-06 NOTE — Progress Notes (Signed)
Pt is deaf, interpreter Melynda Ripple. Pt recently had injection into right wrist for carpal tunnel syndrome.

## 2016-03-06 NOTE — Progress Notes (Signed)
A/ox3, pleased with MAC, report to RN 

## 2016-03-07 ENCOUNTER — Telehealth: Payer: Self-pay | Admitting: *Deleted

## 2016-03-07 NOTE — Telephone Encounter (Signed)
  Follow up Call-  Call back number 03/06/2016  Post procedure Call Back phone  # [phone]  Permission to leave phone message Yes  Some recent data might be hidden     Patient questions:  Do you have a fever, pain , or abdominal swelling? No. Pain Score  0 *  Have you tolerated food without any problems? Yes.    Have you been able to return to your normal activities? Yes.    Do you have any questions about your discharge instructions: Diet   No. Medications  No. Follow up visit  No.  Do you have questions or concerns about your Care? No.  Actions: * If pain score is 4 or above: No action needed, pain <4.

## 2016-03-13 ENCOUNTER — Ambulatory Visit (HOSPITAL_COMMUNITY)
Admission: RE | Admit: 2016-03-13 | Discharge: 2016-03-13 | Disposition: A | Discharge status: Home / Self Care | Payer: BLUE CROSS/BLUE SHIELD | Source: Ambulatory Visit | Attending: Gastroenterology | Admitting: Gastroenterology

## 2016-03-13 ENCOUNTER — Encounter (HOSPITAL_COMMUNITY)
Admission: RE | Disposition: A | Discharge status: Home / Self Care | Payer: Self-pay | Source: Ambulatory Visit | Attending: Gastroenterology

## 2016-03-13 DIAGNOSIS — I10 Essential (primary) hypertension: Secondary | ICD-10-CM | POA: Insufficient documentation

## 2016-03-13 DIAGNOSIS — K219 Gastro-esophageal reflux disease without esophagitis: Secondary | ICD-10-CM | POA: Diagnosis not present

## 2016-03-13 DIAGNOSIS — R1310 Dysphagia, unspecified: Secondary | ICD-10-CM

## 2016-03-13 HISTORY — PX: ESOPHAGEAL MANOMETRY: SHX5429

## 2016-03-13 SURGERY — MANOMETRY, ESOPHAGUS

## 2016-03-13 MED ORDER — LIDOCAINE VISCOUS 2 % MT SOLN
OROMUCOSAL | Status: AC
  Filled 2016-03-13: qty 15

## 2016-03-13 SURGICAL SUPPLY — 2 items
FACESHIELD LNG OPTICON STERILE (SAFETY) IMPLANT
GLOVE BIO SURGEON STRL SZ8 (GLOVE) ×6 IMPLANT

## 2016-03-13 NOTE — H&P (Signed)
History:  This patient presents for endoscopic testing for dysphagia.  Tina Gray Referring physician: Barbette Merino, MD  Past Medical History: Past Medical History:  Diagnosis Date  . Carpal tunnel syndrome   . Chest pain   . Deaf   . GERD (gastroesophageal reflux disease)   . Hypertension      Past Surgical History: Past Surgical History:  Procedure Laterality Date  . LAPAROSCOPIC OVARIAN CYSTECTOMY      Allergies: No Known Allergies  Outpatient Meds: No current facility-administered medications for this encounter.       ___________________________________________________________________ Objective   Exam:  Ht 5\' 5"  (1.651 m)   LMP 06/10/2014   There is no physician contact with the patient for this procedure.  It is performed by the endoscopy nurse.    Assessment:  Dysphagia  Plan:  Esophageal manometry   Tina Gray III

## 2016-03-13 NOTE — Progress Notes (Signed)
Sign Language interpreter at bedside for patient. Esophageal Manometry done per protocol. Pt tolerated well with no complications. Report to be sent to Dr. Corena Pilgrim office

## 2016-03-14 ENCOUNTER — Encounter (HOSPITAL_COMMUNITY): Payer: Self-pay | Admitting: Gastroenterology

## 2016-03-19 DIAGNOSIS — R1310 Dysphagia, unspecified: Secondary | ICD-10-CM

## 2016-03-19 NOTE — Op Note (Signed)
Esophageal Manometry  Procedure  After confirmation of potential allergies, a topical analgesic was used to numb the nares followed by trans-nasal insertion of a High Resolution Manometry Catheter. Pressure bands of both UES and LES were observed on the color contour. Patient instructed to take deep breath to verify placement of catheter; diaphragmatic pinch noted on inspiration. Patient was assisted to supine position and catheter was stabilized. Patient encouraged to relax while acclimating to catheter for approximately 5 minutes. A 30 second baseline pressure was obtained to identify the UES and LES followed by a series of wet swallows using 5 ccs room temperature water to assess esophageal motility. At the conclusion of the procedure; the catheter was removed.   Indications  Dysphagia   Interpretation / Findings  Quality of the study is effected by double swallows Normal resting EG junction pressure with complete relaxation after deglutition IRP 13.4mmHg (Normal Integrated relaxation pressure <34mmHg) Esophageal contractions were 60% (6/10 swallows) failed with absent peristalsis, and the other 30% (3/10 swallows) with normal distal latency, very low distal contractile integral and large breaks (>5cm) in peristaltic wave in 6mmHg isobaric contour No manometric evidence of hiatal hernia Normal basal and residual upper esophageal pressures Complete bolus clearance of 0/10 swallows Band of elevated pressure in distal esophagus above EG junction likely vascular marking  Impression Normal relaxation of the EG junction Ineffective esophageal motility with poor bolus clearance  Tina Hippo, MD [facility]

## 2016-04-17 DIAGNOSIS — Z30018 Encounter for initial prescription of other contraceptives: Secondary | ICD-10-CM | POA: Diagnosis not present

## 2016-05-07 DIAGNOSIS — M5412 Radiculopathy, cervical region: Secondary | ICD-10-CM | POA: Diagnosis not present

## 2016-05-07 DIAGNOSIS — K219 Gastro-esophageal reflux disease without esophagitis: Secondary | ICD-10-CM | POA: Diagnosis not present

## 2016-05-07 DIAGNOSIS — G5600 Carpal tunnel syndrome, unspecified upper limb: Secondary | ICD-10-CM | POA: Diagnosis not present

## 2016-05-07 DIAGNOSIS — I10 Essential (primary) hypertension: Secondary | ICD-10-CM | POA: Diagnosis not present

## 2016-05-13 DIAGNOSIS — Z1231 Encounter for screening mammogram for malignant neoplasm of breast: Secondary | ICD-10-CM | POA: Diagnosis not present

## 2016-05-15 ENCOUNTER — Encounter: Payer: Self-pay | Admitting: Gastroenterology

## 2016-05-15 ENCOUNTER — Ambulatory Visit (INDEPENDENT_AMBULATORY_CARE_PROVIDER_SITE_OTHER): Payer: Medicare Other | Admitting: Gastroenterology

## 2016-05-15 VITALS — BP 110/70 | HR 72 | Ht 66.0 in | Wt 127.4 lb

## 2016-05-15 DIAGNOSIS — R079 Chest pain, unspecified: Secondary | ICD-10-CM | POA: Diagnosis not present

## 2016-05-15 DIAGNOSIS — K224 Dyskinesia of esophagus: Secondary | ICD-10-CM

## 2016-05-15 DIAGNOSIS — R059 Cough, unspecified: Secondary | ICD-10-CM

## 2016-05-15 DIAGNOSIS — R1319 Other dysphagia: Secondary | ICD-10-CM

## 2016-05-15 DIAGNOSIS — R05 Cough: Secondary | ICD-10-CM

## 2016-05-15 DIAGNOSIS — R1310 Dysphagia, unspecified: Secondary | ICD-10-CM

## 2016-05-15 NOTE — Progress Notes (Signed)
[facility] Progress Note  Chief Complaint: chest pain  Subjective  History:  Feels same as before. PPI has not helped. EGD normal Esophageal manometry showed normal LES pressure and relaxation, decreased primary peristalsis (see report) Weight stable Like before, 15-45 minutes after eating she has chest tightness, feels SOB and has yellow "mucous" Lays back and feels SOB at times Only feels this after eating  Nml cardiac echo in June CXR June showed non-specific bibasilar density  ROS: Cardiovascular:  no chest pain Respiratory: no dyspnea  The patient's Past Medical, Family and Social History were reviewed and are on file in the EMR.  Objective:  Med list reviewed  Vital signs in last 24 hrs: Vitals:   05/15/16 0952  BP: 110/70  Pulse: 72    Physical Exam  ESL interpreter used   HEENT: sclera anicteric, oral mucosa moist without lesions  Neck: supple, no thyromegaly, JVD or lymphadenopathy  Cardiac: RRR without murmurs, S1S2 heard, no peripheral edema  Pulm: clear to auscultation bilaterally, normal RR and effort noted  Abdomen: soft, no tenderness, with active bowel sounds. No guarding or palpable hepatosplenomegaly.  Skin; warm and dry, no jaundice or rash.  No exam signs of scleroderma  See above data   @ASSESSMENTPLANBEGIN @ Assessment: Encounter Diagnoses  Name Primary?  . Esophageal dysphagia Yes  . Esophageal motility disorder    Chest pain  The entire scenario does not make sense.  I do not think the manometry findings are related or explain the symptoms, since it occurs so long after eating. And it is chest tightness, which might occur with esophageal spasm, whereas her findings were occ'l breaks in primary peristalsis.  It also does not sound like VC dysfunction, since it is not immediate.   Plan: Ct scan chest.  If certain abnormalities, refer to pulmonary.  Total time 30 minutes, over half spent in counseling and coordination of  care.   Nelida Meuse III   CC: Lahoma Crocker, MD

## 2016-05-15 NOTE — Patient Instructions (Signed)
If you are age 52 or older, your body mass index should be between 23-30. Your Body mass index is 20.56 kg/m. If this is out of the aforementioned range listed, please consider follow up with your Primary Care Provider.  If you are age 76 or younger, your body mass index should be between 19-25. Your Body mass index is 20.56 kg/m. If this is out of the aformentioned range listed, please consider follow up with your Primary Care Provider.   You have been scheduled for a CT scan of the chest at [facility] ([address]---this is in the same building as Press [facility]).   You are scheduled on 05-23-2016 at 2:30pm. You should arrive 15 minutes prior to your appointment time for registration. Please follow the written instructions below on the day of your exam:  WARNING: IF YOU ARE ALLERGIC TO IODINE/X-RAY DYE, PLEASE NOTIFY RADIOLOGY IMMEDIATELY AT [phone]! YOU WILL BE GIVEN A 13 HOUR PREMEDICATION PREP.  Do not eat solids foods 2 hours before the test, liquids are okay.   If you have any questions regarding your exam or if you need to reschedule, you may call the CT department at [phone] between the hours of 8:00 am and 5:00 pm, Monday-Friday.  ________________________________________________________________________   Thank you for choosing [facility]  Dr Wilfrid Lund III

## 2016-05-23 ENCOUNTER — Ambulatory Visit (INDEPENDENT_AMBULATORY_CARE_PROVIDER_SITE_OTHER)
Admission: RE | Admit: 2016-05-23 | Discharge: 2016-05-23 | Disposition: A | Discharge status: Home / Self Care | Payer: Medicare Other | Source: Ambulatory Visit | Attending: Gastroenterology | Admitting: Gastroenterology

## 2016-05-23 DIAGNOSIS — K224 Dyskinesia of esophagus: Secondary | ICD-10-CM | POA: Diagnosis not present

## 2016-05-23 DIAGNOSIS — R1310 Dysphagia, unspecified: Secondary | ICD-10-CM

## 2016-05-23 DIAGNOSIS — R079 Chest pain, unspecified: Secondary | ICD-10-CM | POA: Diagnosis not present

## 2016-05-23 DIAGNOSIS — R1319 Other dysphagia: Secondary | ICD-10-CM

## 2016-05-23 DIAGNOSIS — R918 Other nonspecific abnormal finding of lung field: Secondary | ICD-10-CM | POA: Diagnosis not present

## 2016-05-23 DIAGNOSIS — R059 Cough, unspecified: Secondary | ICD-10-CM

## 2016-05-23 DIAGNOSIS — R05 Cough: Secondary | ICD-10-CM

## 2016-05-23 MED ORDER — IOPAMIDOL (ISOVUE-300) INJECTION 61%
80.0000 mL | Freq: Once | INTRAVENOUS | Status: AC | PRN
  Administered 2016-05-23: 80 mL via INTRAVENOUS

## 2016-05-24 ENCOUNTER — Encounter: Payer: Self-pay | Admitting: Gastroenterology

## 2016-05-24 ENCOUNTER — Other Ambulatory Visit: Payer: Self-pay

## 2016-05-24 DIAGNOSIS — R9389 Abnormal findings on diagnostic imaging of other specified body structures: Secondary | ICD-10-CM

## 2016-05-24 NOTE — Progress Notes (Signed)
Mailed letter °

## 2016-06-25 DIAGNOSIS — M654 Radial styloid tenosynovitis [de Quervain]: Secondary | ICD-10-CM | POA: Diagnosis not present

## 2016-07-31 DIAGNOSIS — M654 Radial styloid tenosynovitis [de Quervain]: Secondary | ICD-10-CM | POA: Diagnosis not present

## 2016-08-28 DIAGNOSIS — M79641 Pain in right hand: Secondary | ICD-10-CM | POA: Diagnosis not present

## 2016-08-28 DIAGNOSIS — M79642 Pain in left hand: Secondary | ICD-10-CM | POA: Diagnosis not present

## 2016-09-03 DIAGNOSIS — I10 Essential (primary) hypertension: Secondary | ICD-10-CM | POA: Diagnosis not present

## 2016-09-03 DIAGNOSIS — H913 Deaf nonspeaking, not elsewhere classified: Secondary | ICD-10-CM | POA: Diagnosis not present

## 2016-09-03 DIAGNOSIS — M79643 Pain in unspecified hand: Secondary | ICD-10-CM | POA: Diagnosis not present

## 2016-09-18 DIAGNOSIS — M79641 Pain in right hand: Secondary | ICD-10-CM | POA: Diagnosis not present

## 2016-09-18 DIAGNOSIS — M79642 Pain in left hand: Secondary | ICD-10-CM | POA: Diagnosis not present

## 2016-09-27 DIAGNOSIS — M542 Cervicalgia: Secondary | ICD-10-CM | POA: Diagnosis not present

## 2016-09-27 DIAGNOSIS — M545 Low back pain: Secondary | ICD-10-CM | POA: Diagnosis not present

## 2016-10-02 DIAGNOSIS — M545 Low back pain: Secondary | ICD-10-CM | POA: Diagnosis not present

## 2016-10-04 DIAGNOSIS — M545 Low back pain: Secondary | ICD-10-CM | POA: Diagnosis not present

## 2016-10-08 DIAGNOSIS — M545 Low back pain: Secondary | ICD-10-CM | POA: Diagnosis not present

## 2016-10-10 DIAGNOSIS — M545 Low back pain: Secondary | ICD-10-CM | POA: Diagnosis not present

## 2016-11-22 DIAGNOSIS — M2550 Pain in unspecified joint: Secondary | ICD-10-CM | POA: Diagnosis not present

## 2017-01-30 DIAGNOSIS — R5383 Other fatigue: Secondary | ICD-10-CM | POA: Diagnosis not present

## 2017-01-30 DIAGNOSIS — R683 Clubbing of fingers: Secondary | ICD-10-CM | POA: Diagnosis not present

## 2017-01-30 DIAGNOSIS — M2550 Pain in unspecified joint: Secondary | ICD-10-CM | POA: Diagnosis not present

## 2017-01-30 DIAGNOSIS — R768 Other specified abnormal immunological findings in serum: Secondary | ICD-10-CM | POA: Diagnosis not present

## 2017-01-30 DIAGNOSIS — M7989 Other specified soft tissue disorders: Secondary | ICD-10-CM | POA: Diagnosis not present

## 2017-01-30 DIAGNOSIS — Z6822 Body mass index (BMI) 22.0-22.9, adult: Secondary | ICD-10-CM | POA: Diagnosis not present

## 2017-02-27 DIAGNOSIS — B370 Candidal stomatitis: Secondary | ICD-10-CM | POA: Diagnosis not present

## 2017-02-27 DIAGNOSIS — J069 Acute upper respiratory infection, unspecified: Secondary | ICD-10-CM | POA: Diagnosis not present

## 2017-02-27 DIAGNOSIS — K219 Gastro-esophageal reflux disease without esophagitis: Secondary | ICD-10-CM | POA: Diagnosis not present

## 2017-02-27 DIAGNOSIS — R1310 Dysphagia, unspecified: Secondary | ICD-10-CM | POA: Diagnosis not present

## 2017-03-05 DIAGNOSIS — R768 Other specified abnormal immunological findings in serum: Secondary | ICD-10-CM | POA: Diagnosis not present

## 2017-03-05 DIAGNOSIS — M7989 Other specified soft tissue disorders: Secondary | ICD-10-CM | POA: Diagnosis not present

## 2017-03-05 DIAGNOSIS — M0579 Rheumatoid arthritis with rheumatoid factor of multiple sites without organ or systems involvement: Secondary | ICD-10-CM | POA: Diagnosis not present

## 2017-03-05 DIAGNOSIS — M2550 Pain in unspecified joint: Secondary | ICD-10-CM | POA: Diagnosis not present

## 2017-03-05 DIAGNOSIS — R683 Clubbing of fingers: Secondary | ICD-10-CM | POA: Diagnosis not present

## 2017-03-05 DIAGNOSIS — R945 Abnormal results of liver function studies: Secondary | ICD-10-CM | POA: Diagnosis not present

## 2017-03-05 DIAGNOSIS — Z6822 Body mass index (BMI) 22.0-22.9, adult: Secondary | ICD-10-CM | POA: Diagnosis not present

## 2017-03-18 ENCOUNTER — Other Ambulatory Visit: Payer: Self-pay | Admitting: Gastroenterology

## 2017-03-18 DIAGNOSIS — R945 Abnormal results of liver function studies: Secondary | ICD-10-CM | POA: Diagnosis not present

## 2017-03-18 DIAGNOSIS — R1011 Right upper quadrant pain: Secondary | ICD-10-CM | POA: Diagnosis not present

## 2017-03-18 DIAGNOSIS — R740 Nonspecific elevation of levels of transaminase and lactic acid dehydrogenase [LDH]: Secondary | ICD-10-CM | POA: Diagnosis not present

## 2017-03-18 DIAGNOSIS — R7989 Other specified abnormal findings of blood chemistry: Secondary | ICD-10-CM

## 2017-03-31 ENCOUNTER — Ambulatory Visit
Admission: RE | Admit: 2017-03-31 | Discharge: 2017-03-31 | Disposition: A | Discharge status: Home / Self Care | Payer: Medicare Other | Source: Ambulatory Visit | Attending: Gastroenterology | Admitting: Gastroenterology

## 2017-03-31 DIAGNOSIS — R7989 Other specified abnormal findings of blood chemistry: Secondary | ICD-10-CM

## 2017-03-31 DIAGNOSIS — R945 Abnormal results of liver function studies: Secondary | ICD-10-CM

## 2017-03-31 DIAGNOSIS — K754 Autoimmune hepatitis: Secondary | ICD-10-CM | POA: Diagnosis not present

## 2017-03-31 DIAGNOSIS — R1013 Epigastric pain: Secondary | ICD-10-CM | POA: Diagnosis not present

## 2017-04-02 ENCOUNTER — Other Ambulatory Visit (HOSPITAL_COMMUNITY): Payer: Self-pay | Admitting: Gastroenterology

## 2017-04-02 DIAGNOSIS — K754 Autoimmune hepatitis: Secondary | ICD-10-CM

## 2017-04-07 DIAGNOSIS — Z01419 Encounter for gynecological examination (general) (routine) without abnormal findings: Secondary | ICD-10-CM | POA: Diagnosis not present

## 2017-04-11 ENCOUNTER — Other Ambulatory Visit: Payer: Self-pay | Admitting: Radiology

## 2017-04-11 ENCOUNTER — Other Ambulatory Visit: Payer: Self-pay | Admitting: Student

## 2017-04-14 ENCOUNTER — Ambulatory Visit (HOSPITAL_COMMUNITY)
Admission: RE | Admit: 2017-04-14 | Discharge: 2017-04-14 | Disposition: A | Discharge status: Home / Self Care | Payer: BLUE CROSS/BLUE SHIELD | Source: Ambulatory Visit | Attending: Gastroenterology | Admitting: Gastroenterology

## 2017-04-14 ENCOUNTER — Encounter (HOSPITAL_COMMUNITY): Payer: Self-pay

## 2017-04-14 DIAGNOSIS — K754 Autoimmune hepatitis: Secondary | ICD-10-CM | POA: Diagnosis present

## 2017-04-14 DIAGNOSIS — K219 Gastro-esophageal reflux disease without esophagitis: Secondary | ICD-10-CM | POA: Insufficient documentation

## 2017-04-14 DIAGNOSIS — I10 Essential (primary) hypertension: Secondary | ICD-10-CM | POA: Diagnosis not present

## 2017-04-14 DIAGNOSIS — G5600 Carpal tunnel syndrome, unspecified upper limb: Secondary | ICD-10-CM | POA: Diagnosis not present

## 2017-04-14 DIAGNOSIS — Z79899 Other long term (current) drug therapy: Secondary | ICD-10-CM | POA: Diagnosis not present

## 2017-04-14 LAB — COMPREHENSIVE METABOLIC PANEL
ALK PHOS: 57 U/L (ref 38–126)
ALT: 137 U/L — AB (ref 14–54)
AST: 128 U/L — AB (ref 15–41)
Albumin: 3.6 g/dL (ref 3.5–5.0)
Anion gap: 6 (ref 5–15)
BUN: 9 mg/dL (ref 6–20)
CALCIUM: 9.1 mg/dL (ref 8.9–10.3)
CHLORIDE: 106 mmol/L (ref 101–111)
CO2: 26 mmol/L (ref 22–32)
CREATININE: 0.71 mg/dL (ref 0.44–1.00)
GFR calc non Af Amer: >60 mL/min (ref >60)
GLUCOSE: 81 mg/dL (ref 65–99)
Potassium: 3.7 mmol/L (ref 3.5–5.1)
SODIUM: 138 mmol/L (ref 135–145)
Total Bilirubin: 0.8 mg/dL (ref 0.3–1.2)
Total Protein: 6.7 g/dL (ref 6.5–8.1)

## 2017-04-14 LAB — CBC WITH DIFFERENTIAL/PLATELET
BASOS ABS: 0 10*3/uL (ref 0.0–0.1)
Basophils Relative: 0 %
EOS ABS: 0.2 10*3/uL (ref 0.0–0.7)
Eosinophils Relative: 3 %
HCT: 43.8 % (ref 36.0–46.0)
HEMOGLOBIN: 14.1 g/dL (ref 12.0–15.0)
LYMPHS ABS: 2 10*3/uL (ref 0.7–4.0)
LYMPHS PCT: 25 %
MCH: 30.3 pg (ref 26.0–34.0)
MCHC: 32.2 g/dL (ref 30.0–36.0)
MCV: 94.2 fL (ref 78.0–100.0)
Monocytes Absolute: 0.8 10*3/uL (ref 0.1–1.0)
Monocytes Relative: 9 %
NEUTROS PCT: 63 %
Neutro Abs: 5.1 10*3/uL (ref 1.7–7.7)
PLATELETS: 212 10*3/uL (ref 150–400)
RBC: 4.65 MIL/uL (ref 3.87–5.11)
RDW: 13.6 % (ref 11.5–15.5)
WBC: 8.1 10*3/uL (ref 4.0–10.5)

## 2017-04-14 LAB — PROTIME-INR
INR: 0.99
Prothrombin Time: 13 seconds (ref 11.4–15.2)

## 2017-04-14 MED ORDER — GELATIN ABSORBABLE 12-7 MM EX MISC
CUTANEOUS | Status: AC
  Filled 2017-04-14: qty 1

## 2017-04-14 MED ORDER — SODIUM CHLORIDE 0.9 % IV SOLN
INTRAVENOUS | Status: DC
  Administered 2017-04-14: 12:00:00 via INTRAVENOUS

## 2017-04-14 MED ORDER — FENTANYL CITRATE (PF) 100 MCG/2ML IJ SOLN
INTRAMUSCULAR | Status: AC | PRN
  Administered 2017-04-14 (×2): 50 ug via INTRAVENOUS

## 2017-04-14 MED ORDER — MIDAZOLAM HCL 2 MG/2ML IJ SOLN
INTRAMUSCULAR | Status: AC
  Filled 2017-04-14: qty 2

## 2017-04-14 MED ORDER — FENTANYL CITRATE (PF) 100 MCG/2ML IJ SOLN
INTRAMUSCULAR | Status: AC
  Filled 2017-04-14: qty 2

## 2017-04-14 MED ORDER — LIDOCAINE HCL (PF) 1 % IJ SOLN
INTRAMUSCULAR | Status: AC
  Filled 2017-04-14: qty 30

## 2017-04-14 MED ORDER — MIDAZOLAM HCL 2 MG/2ML IJ SOLN
INTRAMUSCULAR | Status: AC | PRN
  Administered 2017-04-14 (×2): 1 mg via INTRAVENOUS

## 2017-04-14 NOTE — Procedures (Signed)
Interventional Radiology Procedure Note  Procedure: US guided biopsy of liver  Complications: None  Estimated Blood Loss: < 10 mL  18 g core biopsy x 2 via 17 G needle in right lobe of liver.  Venetia Night. Kathlene Cote, M.D Pager:  [phone]

## 2017-04-14 NOTE — Discharge Instructions (Signed)
Liver Biopsy, Care After These instructions give you information on caring for yourself after your procedure. Your doctor may also give you more specific instructions. Call your doctor if you have any problems or questions after your procedure. Follow these instructions at home:  Rest at home for 1-2 days or as told by your doctor.  Have someone stay with you for at least 24 hours.  Do not do these things in the first 24 hours: ? Drive. ? Use machinery. ? Take care of other people. ? Sign legal documents. ? Take a bath or shower.  There are many different ways to close and cover a cut (incision). For example, a cut can be closed with stitches, skin glue, or adhesive strips. Follow your doctor's instructions on: ? Taking care of your cut. ? Changing and removing your bandage (dressing). ? Removing whatever was used to close your cut.  Do not drink alcohol in the first week.  Do not lift more than 5 pounds or play contact sports for the first 2 weeks.  Take medicines only as told by your doctor. For 1 week, do not take medicine that has aspirin in it or medicines like ibuprofen.  Get your test results. Contact a doctor if:  A cut bleeds and leaves more than just a small spot of blood.  A cut is red, puffs up (swells), or hurts more than before.  Fluid or something else comes from a cut.  A cut smells bad.  You have a fever or chills. Get help right away if:  You have swelling, bloating, or pain in your belly (abdomen).  You get dizzy or faint.  You have a rash.  You feel sick to your stomach (nauseous) or throw up (vomit).  You have trouble breathing, feel short of breath, or feel faint.  Your chest hurts.  You have problems talking or seeing.  You have trouble balancing or moving your arms or legs. This information is not intended to replace advice given to you by your health care provider. Make sure you discuss any questions you have with your health care  provider. Document Released: 03/19/2008 Document Revised: 11/16/2015 Document Reviewed: 08/06/2013 Elsevier Interactive Patient Education  2018 [place] from Work, [employer], or Physical Activity Tina Gray needs to be excused from: __X__ Work ____   [employer] ____ Physical activity beginning now and through the following date: 04/16/17 Wed.  He or she may return to work or school but should still avoid the following physical activity or activities for 2 weeks from 04/14/17.  Activity restrictions include: __X__ Lifting more than 5 lb. ____ Sitting longer than __________ minutes at a time ____ Standing longer than ________ minutes at a time ____ He or she may return to full physical activity as of ________________.  Health Care Provider Name (printed): Dr. Aletta Edouard Date: 04/14/17  This information is not intended to replace advice given to you by your health care provider. Make sure you discuss any questions you have with your health care provider. Document Released: 12/04/2000 Document Revised: 05/24/2016 Document Reviewed: 01/10/2014 Elsevier Interactive Patient Education  [employer].

## 2017-04-14 NOTE — Sedation Documentation (Signed)
Patient is resting comfortably. 

## 2017-04-14 NOTE — Sedation Documentation (Signed)
Bandaid R flank  

## 2017-04-14 NOTE — H&P (Signed)
Chief Complaint: Patient was seen in consultation today for liver core biopsy at the request of Ronnette Juniper  Referring Physician(s): Ronnette Juniper  Supervising Physician: Aletta Edouard  Patient Status: [facility] - Out-pt  History of Present Illness: Tina Gray is a 51 y.o. female   Elevated liver function tests at check up with MD US liver 03/31/17: IMPRESSION: Normal ultrasound appearance of the liver and abdomen.  Now scheduled for liver core biopsy Auto immune hepatitis diagnosis per MD   Past Medical History:  Diagnosis Date  . Carpal tunnel syndrome   . Chest pain   . Deaf   . GERD (gastroesophageal reflux disease)   . Hypertension     Past Surgical History:  Procedure Laterality Date  . ESOPHAGEAL MANOMETRY N/A 03/13/2016   Procedure: ESOPHAGEAL MANOMETRY (EM);  Surgeon: Doran Stabler, MD;  Location: [facility];  Service: Gastroenterology;  Laterality: N/A;  . LAPAROSCOPIC OVARIAN CYSTECTOMY      Allergies: Patient has no known allergies.  Medications: Prior to Admission medications   Medication Sig Start Date End Date Taking? Authorizing Provider  etanercept (ENBREL SURECLICK) 50 MG/ML injection Inject 50 mg as directed every Wednesday. 04/01/17  Yes [provider]  ibuprofen (ADVIL,MOTRIN) 200 MG tablet Take 400 mg by mouth daily as needed for headache.   Yes [provider]  loratadine (CLARITIN) 10 MG tablet Take 10 mg by mouth daily as needed for rhinitis.   Yes [provider]  Multiple Vitamin (MULTIVITAMIN WITH MINERALS) TABS tablet Take 1 tablet by mouth daily.   Yes [provider]  sucralfate (CARAFATE) 1 g tablet Take 1 g by mouth 2 (two) times daily.   Yes [provider]     Family History  Problem Relation Age of Onset  . Heart attack Mother   . Heart disease Mother   . Cancer Father   . Esophageal cancer Father   . Colon cancer Neg Hx   . Rectal cancer Neg Hx   . Stomach cancer Neg Hx      Social History   Social History  . Marital status: Legally Separated    Spouse name: N/A  . Number of children: 2  . Years of education: N/A   Occupational History  . [employer]    Social History Main Topics  . Smoking status: Never Smoker  . Smokeless tobacco: Never Used  . Alcohol use Yes     Comment: rare wine 2-3 month  . Drug use: No  . Sexual activity: Yes    Birth control/ protection: None   Other Topics Concern  . None   Social History Narrative  . None    Review of Systems: A 12 point ROS discussed and pertinent positives are indicated in the HPI above.  All other systems are negative.  Review of Systems  Constitutional: Negative for activity change, appetite change, fatigue, fever and unexpected weight change.  Respiratory: Negative for cough and shortness of breath.   Cardiovascular: Negative for chest pain.  Gastrointestinal: Negative for abdominal pain.  Neurological: Negative for weakness.  Psychiatric/Behavioral: Negative for behavioral problems and confusion.    Vital Signs: BP (!) 151/95 (BP Location: Right Arm)   Pulse 65   Temp 97.9 F (36.6 C) (Oral)   Ht 5\' 5"  (1.651 m)   Wt 136 lb (61.7 kg)   LMP 06/10/2014   SpO2 95%   BMI 22.63 kg/m   Physical Exam  Constitutional: She is oriented to person, place,  and time.  Deaf  Cardiovascular: Normal rate, regular rhythm and normal heart sounds.   Pulmonary/Chest: Effort normal and breath sounds normal. She has no wheezes.  Abdominal: Soft. Bowel sounds are normal. There is no tenderness.  Musculoskeletal: Normal range of motion.  Neurological: She is alert and oriented to person, place, and time.  Skin: Skin is warm and dry.  Psychiatric: She has a normal mood and affect. Her behavior is normal. Judgment and thought content normal.  Nursing note and vitals reviewed.   Imaging: US Abdomen Complete  Result Date: 03/31/2017 CLINICAL DATA:  51 year old female with abnormal LFTs. EXAM:  ABDOMEN ULTRASOUND COMPLETE COMPARISON:  Chest CT 05/23/2016. FINDINGS: Gallbladder: No gallstones or wall thickening visualized. No sonographic Murphy sign noted by sonographer. Common bile duct: Diameter: 2 mm , normal Liver: No focal lesion identified. Within normal limits in parenchymal echogenicity (image 31). Portal vein is patent on color Doppler imaging with normal direction of blood flow towards the liver (image 38). IVC: No abnormality visualized. Pancreas: Visualized portion unremarkable. Spleen: Size and appearance within normal limits. Right Kidney: Length: 9.7 cm. Echogenicity within normal limits. No mass or hydronephrosis visualized. Left Kidney: Length: 11.0 cm. Echogenicity within normal limits. No mass or hydronephrosis visualized. Abdominal aorta: No aneurysm visualized. Other findings: None. IMPRESSION: Normal ultrasound appearance of the liver and abdomen. Electronically Signed   By: Genevie Ann M.D.   On: 03/31/2017 10:18    Labs:  CBC:  Recent Labs  04/14/17 1205  WBC 8.1  HGB 14.1  HCT 43.8  PLT 212    COAGS:  Recent Labs  04/14/17 1205  INR 0.99    BMP:  Recent Labs  04/14/17 1205  NA 138  K 3.7  CL 106  CO2 26  GLUCOSE 81  BUN 9  CALCIUM 9.1  CREATININE 0.71  GFRNONAA >60  GFRAA >60    LIVER FUNCTION TESTS:  Recent Labs  04/14/17 1205  BILITOT 0.8  AST 128*  ALT 137*  ALKPHOS 57  PROT 6.7  ALBUMIN 3.6    TUMOR MARKERS: No results for input(s): AFPTM, CEA, CA199, CHROMGRNA in the last 8760 hours.  Assessment and Plan:  Elevated liver function tests US liver wnl Scheduled for random liver core biopsy Risks and benefits discussed with the patient including, but not limited to bleeding, infection, damage to adjacent structures or low yield requiring additional tests. All of the patient's questions were answered, patient is agreeable to proceed. Consent signed and in chart.   Thank you for this interesting consult.  I greatly  enjoyed meeting Tina Gray and look forward to participating in their care.  A copy of this report was sent to the requesting provider on this date.  Electronically Signed: Lavonia Drafts, PA-C 04/14/2017, 1:26 PM   I spent a total of  30 Minutes   in face to face in clinical consultation, greater than 50% of which was counseling/coordinating care for random liver core bx

## 2017-04-23 DIAGNOSIS — H11002 Unspecified pterygium of left eye: Secondary | ICD-10-CM | POA: Diagnosis not present

## 2017-04-23 DIAGNOSIS — H10413 Chronic giant papillary conjunctivitis, bilateral: Secondary | ICD-10-CM | POA: Diagnosis not present

## 2017-04-23 DIAGNOSIS — H04123 Dry eye syndrome of bilateral lacrimal glands: Secondary | ICD-10-CM | POA: Diagnosis not present

## 2017-05-06 DIAGNOSIS — M2550 Pain in unspecified joint: Secondary | ICD-10-CM | POA: Diagnosis not present

## 2017-05-06 DIAGNOSIS — M7989 Other specified soft tissue disorders: Secondary | ICD-10-CM | POA: Diagnosis not present

## 2017-05-06 DIAGNOSIS — Z79899 Other long term (current) drug therapy: Secondary | ICD-10-CM | POA: Diagnosis not present

## 2017-05-06 DIAGNOSIS — R683 Clubbing of fingers: Secondary | ICD-10-CM | POA: Diagnosis not present

## 2017-05-06 DIAGNOSIS — M0579 Rheumatoid arthritis with rheumatoid factor of multiple sites without organ or systems involvement: Secondary | ICD-10-CM | POA: Diagnosis not present

## 2017-05-06 DIAGNOSIS — R768 Other specified abnormal immunological findings in serum: Secondary | ICD-10-CM | POA: Diagnosis not present

## 2017-05-06 DIAGNOSIS — Z6823 Body mass index (BMI) 23.0-23.9, adult: Secondary | ICD-10-CM | POA: Diagnosis not present

## 2017-05-06 DIAGNOSIS — R945 Abnormal results of liver function studies: Secondary | ICD-10-CM | POA: Diagnosis not present

## 2017-05-06 DIAGNOSIS — Z23 Encounter for immunization: Secondary | ICD-10-CM | POA: Diagnosis not present

## 2017-05-08 ENCOUNTER — Telehealth: Payer: Self-pay | Admitting: Gastroenterology

## 2017-05-08 NOTE — Telephone Encounter (Signed)
Tina Gray, can you see about getting these records for Dr. Loletha Carrow, please. Thanks, Almyra Free

## 2017-05-08 NOTE — Telephone Encounter (Signed)
AST 15 - 41 U/L 128 Abnormally high    ALT 14 - 54 U/L 137 Abnormally high     This is from 04/14/17, prior labs from 05/2013 normal AST/ALT.  Liver biopsy results:Diagnosis Liver, needle/core biopsy, Right Lobe - BENIGN LIVER TISSUE. - SEE MICROSCOPIC DESCRIPTION. Microscopic Comment The core biopsies show liver tissue with normal lobular architecture with normal bile ducts and portal veins. There is no significantly increased inflammation within the portal areas and there are no granulomas present. No increased iron is identified with iron stain and no alpha-1 antitrypsin deposits are identified with PAS stain. There is no increase in fibrosis with trichrome and reticulin stains. The history of autoimmune hepatitis is noted; however, changes of autoimmune hepatitis are not present in these biopsies. (JDP:ecj 04/15/2017)  Labs in [place]. Thanks.

## 2017-05-08 NOTE — Telephone Encounter (Signed)
Appointment with APP has already been filled, next available is 11/20. Please advise, thanks.

## 2017-05-08 NOTE — Telephone Encounter (Signed)
Dr. Loletha Carrow this patient has transferred care to [facility]. She has been seen there for elevated LFTs. Per [place] in scheduling she was seen on Oct 8th and sept 25th for elevated liver functions. Please advise how you would to proceed. Thank you.

## 2017-05-08 NOTE — Telephone Encounter (Signed)
Christy please see Dr. Loletha Carrow' response below and contact rheumatology office.

## 2017-05-08 NOTE — Telephone Encounter (Signed)
I am at the hospital today and tomorrow, so I have no office hours. Please send me a message with the LFT numbers (and any prior that might be available), and I will triage accordingly.

## 2017-05-08 NOTE — Telephone Encounter (Signed)
Please schedule with an APP, there is a 3:00 today with Nevin Bloodgood.

## 2017-05-08 NOTE — Telephone Encounter (Signed)
   Then it seems that there has been some misunderstanding. Thanks for checking into it.  Please contact the rheumatology office and inform them that since this issue is already being attended to by [facility], it is I nthe patient's best interest that they contact [facility] with concerns regarding her elevated LFTs.

## 2017-05-08 NOTE — Telephone Encounter (Signed)
This can wait until I am back in the office next week and I will triage it then.  However, we definitely need office notes from rheumatology.  We have not seen this patient since last year, and I have no idea what her rheumatologic issues are or what workup led to this concern of AIH.

## 2017-05-09 NOTE — Telephone Encounter (Signed)
Called and LM for [facility] regarding Dr. Corena Pilgrim recommendations to see [facility].

## 2017-05-22 ENCOUNTER — Ambulatory Visit (HOSPITAL_COMMUNITY)
Admission: EM | Admit: 2017-05-22 | Discharge: 2017-05-22 | Disposition: A | Discharge status: Home / Self Care | Payer: BLUE CROSS/BLUE SHIELD | Attending: Physician Assistant | Admitting: Physician Assistant

## 2017-05-22 ENCOUNTER — Other Ambulatory Visit: Payer: Self-pay

## 2017-05-22 ENCOUNTER — Encounter (HOSPITAL_COMMUNITY): Payer: Self-pay | Admitting: Emergency Medicine

## 2017-05-22 DIAGNOSIS — M1990 Unspecified osteoarthritis, unspecified site: Secondary | ICD-10-CM

## 2017-05-22 DIAGNOSIS — M79605 Pain in left leg: Secondary | ICD-10-CM | POA: Diagnosis not present

## 2017-05-22 DIAGNOSIS — M79604 Pain in right leg: Secondary | ICD-10-CM | POA: Diagnosis not present

## 2017-05-22 MED ORDER — LORATADINE 10 MG PO TABS: 10.0000 mg | ORAL_TABLET | Freq: Every day | ORAL | 0 refills | Status: DC

## 2017-05-22 MED ORDER — HYDROCODONE-ACETAMINOPHEN 5-325 MG PO TABS: 1.0000 | ORAL_TABLET | ORAL | 0 refills | Status: DC | PRN

## 2017-05-22 NOTE — ED Provider Notes (Signed)
[facility]    CSN: [id number] Arrival date & time: 05/22/17  1004     History   Chief Complaint Chief Complaint  Patient presents with  . Leg Pain    HPI Tina Gray is a 51 y.o. female.   51 year-old female, presenting today due to leg pain. She states that she has a history of RA and was started on Enbrel injections about 5 weeks ago. States that she was giving herself the injections in the anterior thigh once a week. States that she stopped the injections due to the pain and has been on [place] since that time. She continues to complain of anterior thigh pain and weakness. She has not seen her physician since starting the xeljanz  No fever or chills   The history is provided by the patient.  Leg Pain  Location:  Leg Leg location:  R upper leg and L upper leg Pain details:    Quality:  Aching   Radiates to:  Does not radiate   Severity:  Moderate   Onset quality:  Gradual   Duration:  5 weeks   Timing:  Constant   Progression:  Unchanged Chronicity:  Recurrent Dislocation: no   Foreign body present:  No foreign bodies Tetanus status:  Unknown Prior injury to area:  No Relieved by:  Nothing Worsened by:  Nothing Ineffective treatments:  None tried Associated symptoms: stiffness   Associated symptoms: no back pain, no decreased ROM, no fatigue, no fever, no itching, no muscle weakness, no neck pain, no numbness and no swelling   Risk factors: no concern for non-accidental trauma, no frequent fractures and no known bone disorder     Past Medical History:  Diagnosis Date  . Carpal tunnel syndrome   . Chest pain   . Deaf   . GERD (gastroesophageal reflux disease)   . Hypertension     Patient Active Problem List   Diagnosis Date Noted  . Dysphagia   . Chest pain at rest 12/02/2015  . HTN (hypertension) 12/02/2015  . Hypokalemia 12/02/2015  . Elevated troponin 12/02/2015  . Chest pain 12/02/2015  . Right arm pain 12/02/2015  . Abnormal  uterine bleeding 03/17/2013  . Anemia, iron deficiency 03/05/2013    Past Surgical History:  Procedure Laterality Date  . ESOPHAGEAL MANOMETRY N/A 03/13/2016   Procedure: ESOPHAGEAL MANOMETRY (EM);  Surgeon: Doran Stabler, MD;  Location: [facility];  Service: Gastroenterology;  Laterality: N/A;  . LAPAROSCOPIC OVARIAN CYSTECTOMY      OB History    Gravida Para Term Preterm AB Living   2 2       2    SAB TAB Ectopic Multiple Live Births                   Home Medications    Prior to Admission medications   Medication Sig Start Date End Date Taking? Authorizing Provider  etanercept (ENBREL SURECLICK) 50 MG/ML injection Inject 50 mg as directed every Wednesday. 04/01/17   [provider]  HYDROcodone-acetaminophen (NORCO/VICODIN) 5-325 MG tablet Take 1 tablet by mouth every 4 (four) hours as needed. 05/22/17   Althea Backs C, PA-C  ibuprofen (ADVIL,MOTRIN) 200 MG tablet Take 400 mg by mouth daily as needed for headache.    [provider]  loratadine (CLARITIN) 10 MG tablet Take 1 tablet (10 mg total) by mouth daily. 05/22/17   Shelda Truby C, PA-C  Multiple Vitamin (MULTIVITAMIN WITH MINERALS) TABS tablet Take 1 tablet  by mouth daily.    [provider]  sucralfate (CARAFATE) 1 g tablet Take 1 g by mouth 2 (two) times daily.    [provider]    Family History Family History  Problem Relation Age of Onset  . Heart attack Mother   . Heart disease Mother   . Cancer Father   . Esophageal cancer Father   . Colon cancer Neg Hx   . Rectal cancer Neg Hx   . Stomach cancer Neg Hx     Social History Social History   Tobacco Use  . Smoking status: Never Smoker  . Smokeless tobacco: Never Used  Substance Use Topics  . Alcohol use: Yes    Comment: rare wine 2-3 month  . Drug use: No     Allergies   Patient has no known allergies.   Review of Systems Review of Systems  Constitutional: Negative for chills, fatigue and fever.    HENT: Negative for ear pain and sore throat.   Eyes: Negative for pain and visual disturbance.  Respiratory: Negative for cough and shortness of breath.   Cardiovascular: Negative for chest pain and palpitations.  Gastrointestinal: Negative for abdominal pain and vomiting.  Genitourinary: Negative for dysuria and hematuria.  Musculoskeletal: Positive for arthralgias and stiffness. Negative for back pain and neck pain.  Skin: Negative for color change, itching and rash.  Neurological: Negative for seizures and syncope.  All other systems reviewed and are negative.    Physical Exam Triage Vital Signs ED Triage Vitals  Enc Vitals Group     BP 05/22/17 1057 125/76     Pulse Rate 05/22/17 1057 88     Resp 05/22/17 1057 18     Temp 05/22/17 1057 98.2 F (36.8 C)     Temp src --      SpO2 05/22/17 1057 100 %     Weight --      Height --      Head Circumference --      Peak Flow --      Pain Score 05/22/17 1100 6     Pain Loc --      Pain Edu? --      Excl. in [place]? --    No data found.  Updated Vital Signs BP 125/76   Pulse 88   Temp 98.2 F (36.8 C)   Resp 18   LMP 06/10/2014   SpO2 100%   Visual Acuity Right Eye Distance:   Left Eye Distance:   Bilateral Distance:    Right Eye Near:   Left Eye Near:    Bilateral Near:     Physical Exam  Constitutional: She appears well-developed and well-nourished. No distress.  HENT:  Head: Normocephalic and atraumatic.  Eyes: Conjunctivae are normal.  Neck: Neck supple.  Cardiovascular: Normal rate and regular rhythm.  No murmur heard. Pulmonary/Chest: Effort normal and breath sounds normal. No respiratory distress.  Abdominal: Soft. There is no tenderness.  Musculoskeletal: She exhibits no edema.       Right upper leg: She exhibits tenderness.       Left upper leg: She exhibits tenderness.  Tenderness to the anterior thigh - no skin changes noted   Neurological: She is alert.  Skin: Skin is warm and dry.   Psychiatric: She has a normal mood and affect.  Nursing note and vitals reviewed.    UC Treatments / Results  Labs (all labs ordered are listed, but only abnormal results are displayed) Labs Reviewed -  No data to display  EKG  EKG Interpretation None       Radiology No results found.  Procedures Procedures (including critical care time)  Medications Ordered in UC Medications - No data to display   Initial Impression / Assessment and Plan / UC Course  I have reviewed the triage vital signs and the nursing notes.  Pertinent labs & imaging results that were available during my care of the patient were reviewed by me and considered in my medical decision making (see chart for details).     Leg pain after start enbrel injecitons - she has since stopped taking these. No skin changes. Possible hematoma as the source of the pain. Given pain medication and referred to PCP to discuss ongoing management   Final Clinical Impressions(s) / UC Diagnoses   Final diagnoses:  Pain in both lower extremities  Arthritis    ED Discharge Orders        Ordered    HYDROcodone-acetaminophen (NORCO/VICODIN) 5-325 MG tablet  Every 4 hours PRN     05/22/17 1120    loratadine (CLARITIN) 10 MG tablet  Daily     05/22/17 1120       Controlled Substance Prescriptions [place] Controlled Substance Registry consulted? Yes, I have consulted the [place] Controlled Substances Registry for this patient, and feel the risk/benefit ratio today is favorable for proceeding with this prescription for a controlled substance.   Phebe Colla, [place] 05/22/17 1152

## 2017-05-22 NOTE — ED Triage Notes (Signed)
Per ASL interpreter, "I had some injections in my knees ago, about five weeks ago, the shots cause too much pain so I stopped the injection, I went back tot he doctor and said I was still in pain, and they changed the medicine, and im still in pain." Pt states she has the shots for arthritis pain. Still c/o leg pain.

## 2017-05-27 DIAGNOSIS — K754 Autoimmune hepatitis: Secondary | ICD-10-CM | POA: Diagnosis not present

## 2017-05-27 DIAGNOSIS — H913 Deaf nonspeaking, not elsewhere classified: Secondary | ICD-10-CM | POA: Diagnosis not present

## 2017-05-27 DIAGNOSIS — M79651 Pain in right thigh: Secondary | ICD-10-CM | POA: Diagnosis not present

## 2017-07-15 DIAGNOSIS — M150 Primary generalized (osteo)arthritis: Secondary | ICD-10-CM | POA: Diagnosis not present

## 2017-07-15 DIAGNOSIS — J9801 Acute bronchospasm: Secondary | ICD-10-CM | POA: Diagnosis not present

## 2017-07-15 DIAGNOSIS — K210 Gastro-esophageal reflux disease with esophagitis: Secondary | ICD-10-CM | POA: Diagnosis not present

## 2017-08-12 DIAGNOSIS — M0579 Rheumatoid arthritis with rheumatoid factor of multiple sites without organ or systems involvement: Secondary | ICD-10-CM | POA: Diagnosis not present

## 2017-08-12 DIAGNOSIS — M2550 Pain in unspecified joint: Secondary | ICD-10-CM | POA: Diagnosis not present

## 2017-08-12 DIAGNOSIS — Z23 Encounter for immunization: Secondary | ICD-10-CM | POA: Diagnosis not present

## 2017-08-12 DIAGNOSIS — R945 Abnormal results of liver function studies: Secondary | ICD-10-CM | POA: Diagnosis not present

## 2017-08-12 DIAGNOSIS — R683 Clubbing of fingers: Secondary | ICD-10-CM | POA: Diagnosis not present

## 2017-08-12 DIAGNOSIS — M7989 Other specified soft tissue disorders: Secondary | ICD-10-CM | POA: Diagnosis not present

## 2017-08-12 DIAGNOSIS — Z6822 Body mass index (BMI) 22.0-22.9, adult: Secondary | ICD-10-CM | POA: Diagnosis not present

## 2017-08-12 DIAGNOSIS — R768 Other specified abnormal immunological findings in serum: Secondary | ICD-10-CM | POA: Diagnosis not present

## 2017-08-18 DIAGNOSIS — R768 Other specified abnormal immunological findings in serum: Secondary | ICD-10-CM | POA: Diagnosis not present

## 2017-08-18 DIAGNOSIS — K754 Autoimmune hepatitis: Secondary | ICD-10-CM | POA: Diagnosis not present

## 2017-08-18 DIAGNOSIS — R945 Abnormal results of liver function studies: Secondary | ICD-10-CM | POA: Diagnosis not present

## 2017-09-09 DIAGNOSIS — R683 Clubbing of fingers: Secondary | ICD-10-CM | POA: Diagnosis not present

## 2017-09-09 DIAGNOSIS — M7989 Other specified soft tissue disorders: Secondary | ICD-10-CM | POA: Diagnosis not present

## 2017-09-09 DIAGNOSIS — Z23 Encounter for immunization: Secondary | ICD-10-CM | POA: Diagnosis not present

## 2017-09-09 DIAGNOSIS — R768 Other specified abnormal immunological findings in serum: Secondary | ICD-10-CM | POA: Diagnosis not present

## 2017-09-09 DIAGNOSIS — R29898 Other symptoms and signs involving the musculoskeletal system: Secondary | ICD-10-CM | POA: Diagnosis not present

## 2017-09-09 DIAGNOSIS — R945 Abnormal results of liver function studies: Secondary | ICD-10-CM | POA: Diagnosis not present

## 2017-09-09 DIAGNOSIS — M0579 Rheumatoid arthritis with rheumatoid factor of multiple sites without organ or systems involvement: Secondary | ICD-10-CM | POA: Diagnosis not present

## 2017-09-09 DIAGNOSIS — Z6821 Body mass index (BMI) 21.0-21.9, adult: Secondary | ICD-10-CM | POA: Diagnosis not present

## 2017-09-09 DIAGNOSIS — M2550 Pain in unspecified joint: Secondary | ICD-10-CM | POA: Diagnosis not present

## 2017-09-22 DIAGNOSIS — K754 Autoimmune hepatitis: Secondary | ICD-10-CM | POA: Diagnosis not present

## 2017-09-29 DIAGNOSIS — K754 Autoimmune hepatitis: Secondary | ICD-10-CM | POA: Diagnosis not present

## 2017-10-28 ENCOUNTER — Other Ambulatory Visit: Payer: Self-pay | Admitting: Physician Assistant

## 2017-11-04 ENCOUNTER — Other Ambulatory Visit: Payer: Self-pay | Admitting: Physician Assistant

## 2017-11-04 DIAGNOSIS — K754 Autoimmune hepatitis: Secondary | ICD-10-CM | POA: Diagnosis not present

## 2017-11-04 DIAGNOSIS — R29898 Other symptoms and signs involving the musculoskeletal system: Secondary | ICD-10-CM

## 2017-11-05 DIAGNOSIS — Z6822 Body mass index (BMI) 22.0-22.9, adult: Secondary | ICD-10-CM | POA: Diagnosis not present

## 2017-11-05 DIAGNOSIS — M3320 Polymyositis, organ involvement unspecified: Secondary | ICD-10-CM | POA: Diagnosis not present

## 2017-11-05 DIAGNOSIS — M2550 Pain in unspecified joint: Secondary | ICD-10-CM | POA: Diagnosis not present

## 2017-11-05 DIAGNOSIS — R683 Clubbing of fingers: Secondary | ICD-10-CM | POA: Diagnosis not present

## 2017-11-05 DIAGNOSIS — M0579 Rheumatoid arthritis with rheumatoid factor of multiple sites without organ or systems involvement: Secondary | ICD-10-CM | POA: Diagnosis not present

## 2017-11-05 DIAGNOSIS — R768 Other specified abnormal immunological findings in serum: Secondary | ICD-10-CM | POA: Diagnosis not present

## 2017-11-05 DIAGNOSIS — M7989 Other specified soft tissue disorders: Secondary | ICD-10-CM | POA: Diagnosis not present

## 2017-11-05 DIAGNOSIS — R29898 Other symptoms and signs involving the musculoskeletal system: Secondary | ICD-10-CM | POA: Diagnosis not present

## 2017-11-05 DIAGNOSIS — R945 Abnormal results of liver function studies: Secondary | ICD-10-CM | POA: Diagnosis not present

## 2017-11-11 DIAGNOSIS — K754 Autoimmune hepatitis: Secondary | ICD-10-CM | POA: Diagnosis not present

## 2017-11-12 ENCOUNTER — Ambulatory Visit
Admission: RE | Admit: 2017-11-12 | Discharge: 2017-11-12 | Disposition: A | Discharge status: Home / Self Care | Payer: BLUE CROSS/BLUE SHIELD | Source: Ambulatory Visit | Attending: Physician Assistant | Admitting: Physician Assistant

## 2017-11-12 DIAGNOSIS — R600 Localized edema: Secondary | ICD-10-CM | POA: Diagnosis not present

## 2017-11-12 DIAGNOSIS — R29898 Other symptoms and signs involving the musculoskeletal system: Secondary | ICD-10-CM

## 2017-12-08 DIAGNOSIS — M3320 Polymyositis, organ involvement unspecified: Secondary | ICD-10-CM | POA: Diagnosis not present

## 2018-01-12 DIAGNOSIS — Z79899 Other long term (current) drug therapy: Secondary | ICD-10-CM | POA: Diagnosis not present

## 2018-01-12 DIAGNOSIS — Z6824 Body mass index (BMI) 24.0-24.9, adult: Secondary | ICD-10-CM | POA: Diagnosis not present

## 2018-01-12 DIAGNOSIS — M3320 Polymyositis, organ involvement unspecified: Secondary | ICD-10-CM | POA: Diagnosis not present

## 2018-01-12 DIAGNOSIS — M2550 Pain in unspecified joint: Secondary | ICD-10-CM | POA: Diagnosis not present

## 2018-01-12 DIAGNOSIS — R29898 Other symptoms and signs involving the musculoskeletal system: Secondary | ICD-10-CM | POA: Diagnosis not present

## 2018-01-12 DIAGNOSIS — M0579 Rheumatoid arthritis with rheumatoid factor of multiple sites without organ or systems involvement: Secondary | ICD-10-CM | POA: Diagnosis not present

## 2018-01-26 DIAGNOSIS — K754 Autoimmune hepatitis: Secondary | ICD-10-CM | POA: Diagnosis not present

## 2018-01-29 DIAGNOSIS — K210 Gastro-esophageal reflux disease with esophagitis: Secondary | ICD-10-CM | POA: Diagnosis not present

## 2018-01-29 DIAGNOSIS — H913 Deaf nonspeaking, not elsewhere classified: Secondary | ICD-10-CM | POA: Diagnosis not present

## 2018-01-29 DIAGNOSIS — I10 Essential (primary) hypertension: Secondary | ICD-10-CM | POA: Diagnosis not present

## 2018-03-02 DIAGNOSIS — E663 Overweight: Secondary | ICD-10-CM | POA: Diagnosis not present

## 2018-03-02 DIAGNOSIS — Z6825 Body mass index (BMI) 25.0-25.9, adult: Secondary | ICD-10-CM | POA: Diagnosis not present

## 2018-03-02 DIAGNOSIS — M3320 Polymyositis, organ involvement unspecified: Secondary | ICD-10-CM | POA: Diagnosis not present

## 2018-03-02 DIAGNOSIS — M0579 Rheumatoid arthritis with rheumatoid factor of multiple sites without organ or systems involvement: Secondary | ICD-10-CM | POA: Diagnosis not present

## 2018-03-02 DIAGNOSIS — R29898 Other symptoms and signs involving the musculoskeletal system: Secondary | ICD-10-CM | POA: Diagnosis not present

## 2018-03-02 DIAGNOSIS — M2550 Pain in unspecified joint: Secondary | ICD-10-CM | POA: Diagnosis not present

## 2018-03-02 DIAGNOSIS — Z79899 Other long term (current) drug therapy: Secondary | ICD-10-CM | POA: Diagnosis not present

## 2018-03-11 DIAGNOSIS — K754 Autoimmune hepatitis: Secondary | ICD-10-CM | POA: Diagnosis not present

## 2018-03-27 DIAGNOSIS — K754 Autoimmune hepatitis: Secondary | ICD-10-CM | POA: Diagnosis not present

## 2018-03-27 DIAGNOSIS — I10 Essential (primary) hypertension: Secondary | ICD-10-CM | POA: Diagnosis not present

## 2018-03-27 DIAGNOSIS — G4700 Insomnia, unspecified: Secondary | ICD-10-CM | POA: Diagnosis not present

## 2018-04-23 DIAGNOSIS — R29898 Other symptoms and signs involving the musculoskeletal system: Secondary | ICD-10-CM | POA: Diagnosis not present

## 2018-04-23 DIAGNOSIS — M0579 Rheumatoid arthritis with rheumatoid factor of multiple sites without organ or systems involvement: Secondary | ICD-10-CM | POA: Diagnosis not present

## 2018-04-23 DIAGNOSIS — Z79899 Other long term (current) drug therapy: Secondary | ICD-10-CM | POA: Diagnosis not present

## 2018-04-23 DIAGNOSIS — M2550 Pain in unspecified joint: Secondary | ICD-10-CM | POA: Diagnosis not present

## 2018-04-23 DIAGNOSIS — Z6825 Body mass index (BMI) 25.0-25.9, adult: Secondary | ICD-10-CM | POA: Diagnosis not present

## 2018-04-23 DIAGNOSIS — M3320 Polymyositis, organ involvement unspecified: Secondary | ICD-10-CM | POA: Diagnosis not present

## 2018-04-23 DIAGNOSIS — E663 Overweight: Secondary | ICD-10-CM | POA: Diagnosis not present

## 2018-05-11 DIAGNOSIS — Z1231 Encounter for screening mammogram for malignant neoplasm of breast: Secondary | ICD-10-CM | POA: Diagnosis not present

## 2018-05-19 DIAGNOSIS — E663 Overweight: Secondary | ICD-10-CM | POA: Diagnosis not present

## 2018-05-19 DIAGNOSIS — Z79899 Other long term (current) drug therapy: Secondary | ICD-10-CM | POA: Diagnosis not present

## 2018-05-19 DIAGNOSIS — M2550 Pain in unspecified joint: Secondary | ICD-10-CM | POA: Diagnosis not present

## 2018-05-19 DIAGNOSIS — R29898 Other symptoms and signs involving the musculoskeletal system: Secondary | ICD-10-CM | POA: Diagnosis not present

## 2018-05-19 DIAGNOSIS — M0579 Rheumatoid arthritis with rheumatoid factor of multiple sites without organ or systems involvement: Secondary | ICD-10-CM | POA: Diagnosis not present

## 2018-05-19 DIAGNOSIS — Z6825 Body mass index (BMI) 25.0-25.9, adult: Secondary | ICD-10-CM | POA: Diagnosis not present

## 2018-05-19 DIAGNOSIS — M3320 Polymyositis, organ involvement unspecified: Secondary | ICD-10-CM | POA: Diagnosis not present

## 2018-06-10 DIAGNOSIS — Z1211 Encounter for screening for malignant neoplasm of colon: Secondary | ICD-10-CM | POA: Diagnosis not present

## 2018-06-10 DIAGNOSIS — K754 Autoimmune hepatitis: Secondary | ICD-10-CM | POA: Diagnosis not present

## 2018-06-25 DIAGNOSIS — Z6824 Body mass index (BMI) 24.0-24.9, adult: Secondary | ICD-10-CM | POA: Diagnosis not present

## 2018-06-25 DIAGNOSIS — Z79899 Other long term (current) drug therapy: Secondary | ICD-10-CM | POA: Diagnosis not present

## 2018-06-25 DIAGNOSIS — M3320 Polymyositis, organ involvement unspecified: Secondary | ICD-10-CM | POA: Diagnosis not present

## 2018-06-25 DIAGNOSIS — M0579 Rheumatoid arthritis with rheumatoid factor of multiple sites without organ or systems involvement: Secondary | ICD-10-CM | POA: Diagnosis not present

## 2018-06-25 DIAGNOSIS — M2550 Pain in unspecified joint: Secondary | ICD-10-CM | POA: Diagnosis not present

## 2018-07-23 DIAGNOSIS — D122 Benign neoplasm of ascending colon: Secondary | ICD-10-CM | POA: Diagnosis not present

## 2018-07-23 DIAGNOSIS — K5730 Diverticulosis of large intestine without perforation or abscess without bleeding: Secondary | ICD-10-CM | POA: Diagnosis not present

## 2018-07-23 DIAGNOSIS — Z1211 Encounter for screening for malignant neoplasm of colon: Secondary | ICD-10-CM | POA: Diagnosis not present

## 2018-07-28 DIAGNOSIS — D122 Benign neoplasm of ascending colon: Secondary | ICD-10-CM | POA: Diagnosis not present

## 2018-08-11 DIAGNOSIS — Z79899 Other long term (current) drug therapy: Secondary | ICD-10-CM | POA: Diagnosis not present

## 2018-08-11 DIAGNOSIS — M2550 Pain in unspecified joint: Secondary | ICD-10-CM | POA: Diagnosis not present

## 2018-08-11 DIAGNOSIS — M0579 Rheumatoid arthritis with rheumatoid factor of multiple sites without organ or systems involvement: Secondary | ICD-10-CM | POA: Diagnosis not present

## 2018-08-11 DIAGNOSIS — M3320 Polymyositis, organ involvement unspecified: Secondary | ICD-10-CM | POA: Diagnosis not present

## 2018-08-11 DIAGNOSIS — Z6824 Body mass index (BMI) 24.0-24.9, adult: Secondary | ICD-10-CM | POA: Diagnosis not present

## 2018-08-27 DIAGNOSIS — M5412 Radiculopathy, cervical region: Secondary | ICD-10-CM | POA: Diagnosis not present

## 2018-08-27 DIAGNOSIS — I10 Essential (primary) hypertension: Secondary | ICD-10-CM | POA: Diagnosis not present

## 2018-10-16 IMAGING — US US BIOPSY CORE LIVER
1 series · 7 of 7 positions shown · non-contrast
Comparison: none

CLINICAL DATA: Autoimmune hepatitis and need for liver biopsy.

[Series 1: us biopsy core liver · 0.20mm/px · 7 of 7 slices shown]
[im 1/7]
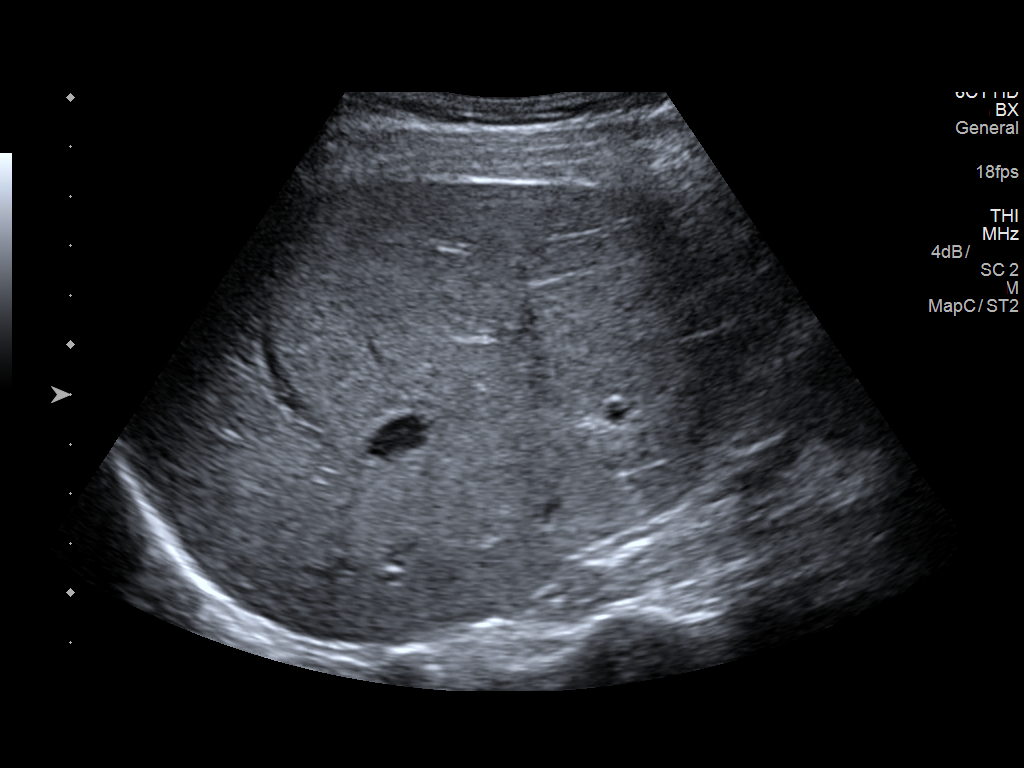
[im 2/7]
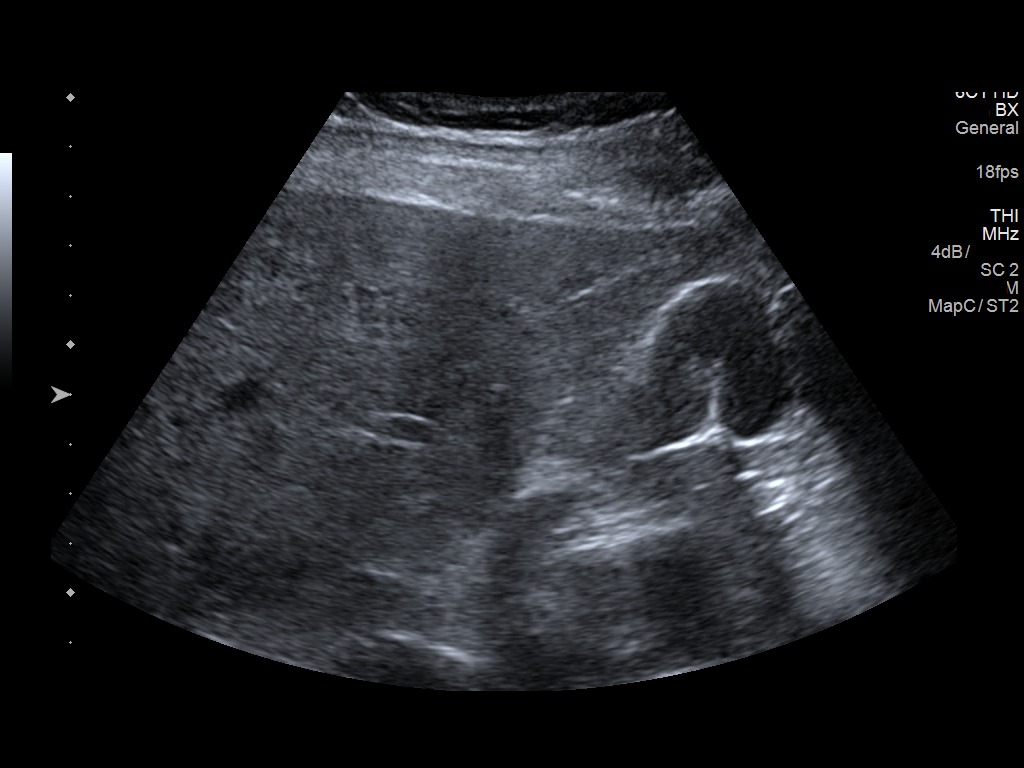
[im 3/7]
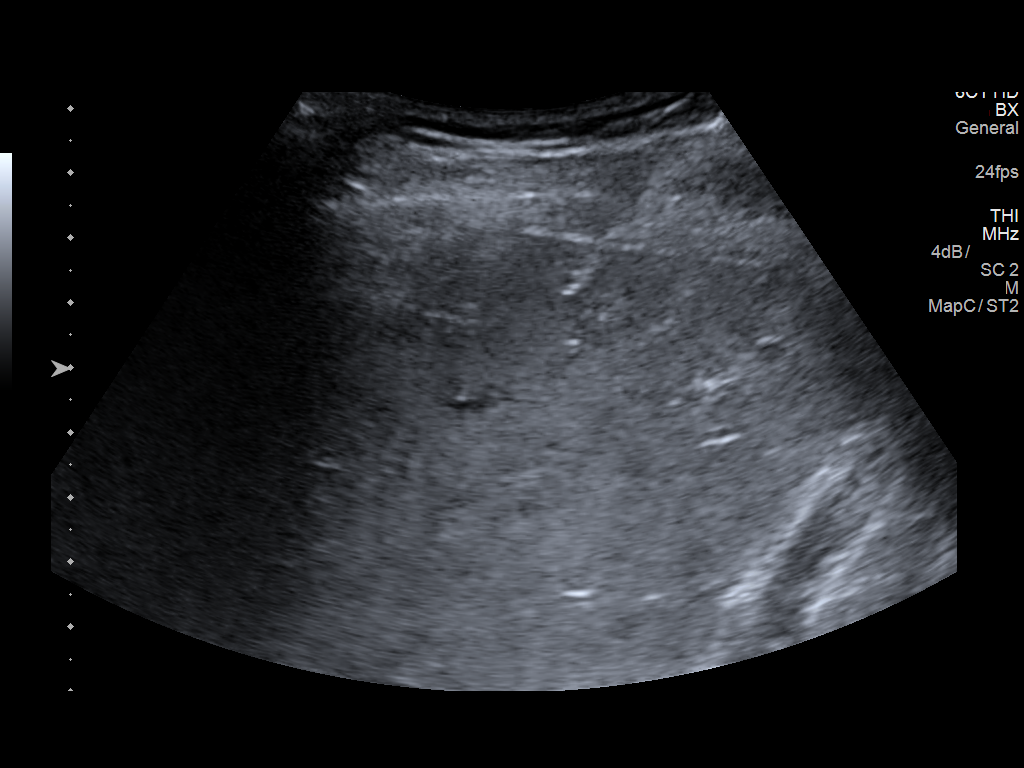
[im 4/7]
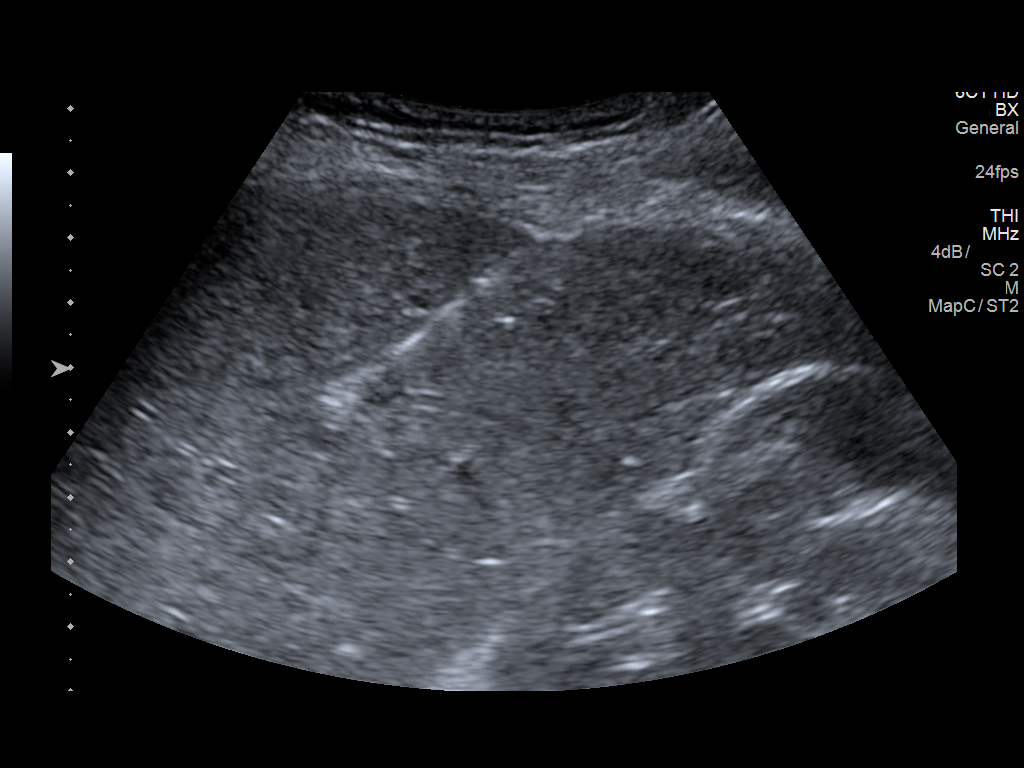
[im 5/7]
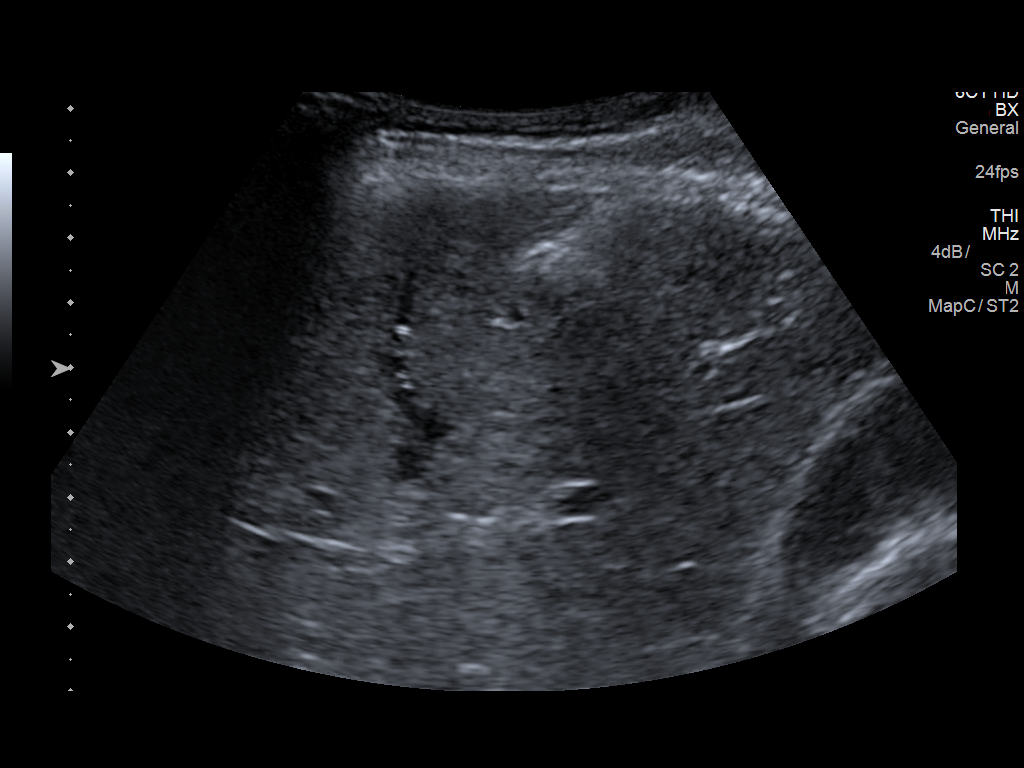
[im 6/7]
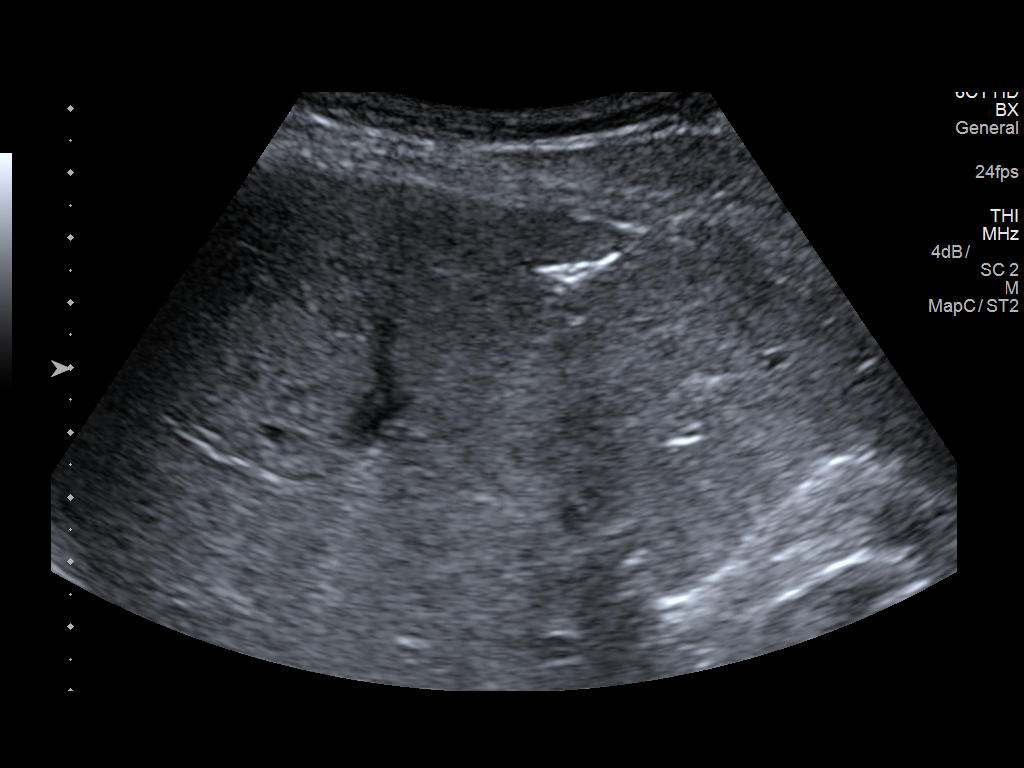
[im 7/7]
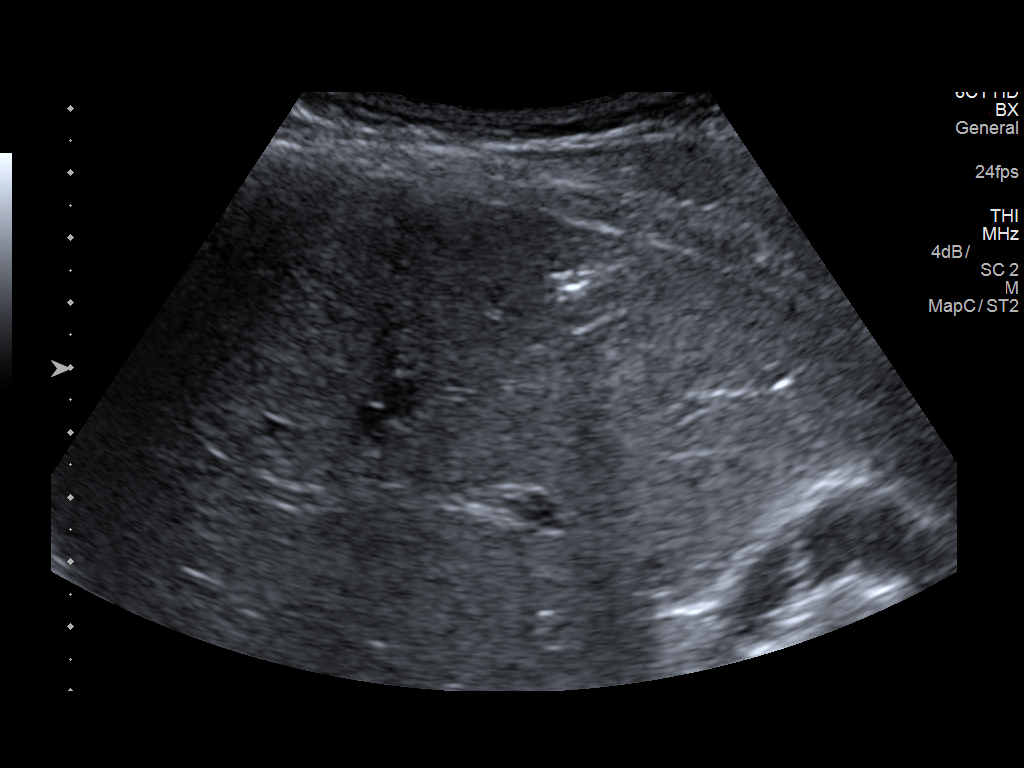

[7 of 7 positions shown; findings below may reference images not displayed]

EXAM:
ULTRASOUND GUIDED CORE BIOPSY OF LIVER

MEDICATIONS:
2.0 mg IV Versed; 100 mcg IV Fentanyl

Total Moderate Sedation Time: 15 minutes.

The patient's level of consciousness and physiologic status were
continuously monitored during the procedure by Radiology nursing.

PROCEDURE:
The procedure, risks, benefits, and alternatives were explained to
the patient. Questions regarding the procedure were encouraged and
answered. The patient understands and consents to the procedure. A
time out was performed prior to initiating the procedure.

The abdominal wall was prepped with chlorhexidine in a sterile
fashion, and a sterile drape was applied covering the operative
field. A sterile gown and sterile gloves were used for the
procedure. Local anesthesia was provided with 1% Lidocaine.

Ultrasound was performed of the liver. Under direct ultrasound
guidance, a 17 gauge trocar needle was advanced into the right lobe.
After confirming needle tip position, 2 separate 18 gauge coaxial
core biopsy samples were obtained and submitted in formalin.
Gel-Foam pledgets were advanced through the 17 gauge needle as it
was retracted. Additional ultrasound was performed.

COMPLICATIONS:
None.
FINDINGS: Solid samples were obtained from the liver parenchyma.
IMPRESSION: Ultrasound-guided core biopsy performed of the liver within the
right lobe.

## 2018-11-03 DIAGNOSIS — M5410 Radiculopathy, site unspecified: Secondary | ICD-10-CM | POA: Diagnosis not present

## 2018-11-03 DIAGNOSIS — Z79899 Other long term (current) drug therapy: Secondary | ICD-10-CM | POA: Diagnosis not present

## 2018-11-03 DIAGNOSIS — M2550 Pain in unspecified joint: Secondary | ICD-10-CM | POA: Diagnosis not present

## 2018-11-03 DIAGNOSIS — M3320 Polymyositis, organ involvement unspecified: Secondary | ICD-10-CM | POA: Diagnosis not present

## 2018-11-03 DIAGNOSIS — M0579 Rheumatoid arthritis with rheumatoid factor of multiple sites without organ or systems involvement: Secondary | ICD-10-CM | POA: Diagnosis not present

## 2018-11-03 DIAGNOSIS — M546 Pain in thoracic spine: Secondary | ICD-10-CM | POA: Diagnosis not present

## 2018-11-10 DIAGNOSIS — H04123 Dry eye syndrome of bilateral lacrimal glands: Secondary | ICD-10-CM | POA: Diagnosis not present

## 2018-11-10 DIAGNOSIS — H5711 Ocular pain, right eye: Secondary | ICD-10-CM | POA: Diagnosis not present

## 2018-11-10 DIAGNOSIS — H10413 Chronic giant papillary conjunctivitis, bilateral: Secondary | ICD-10-CM | POA: Diagnosis not present

## 2018-11-10 DIAGNOSIS — H11042 Peripheral pterygium, stationary, left eye: Secondary | ICD-10-CM | POA: Diagnosis not present

## 2019-01-05 DIAGNOSIS — M2550 Pain in unspecified joint: Secondary | ICD-10-CM | POA: Diagnosis not present

## 2019-01-05 DIAGNOSIS — M0579 Rheumatoid arthritis with rheumatoid factor of multiple sites without organ or systems involvement: Secondary | ICD-10-CM | POA: Diagnosis not present

## 2019-01-05 DIAGNOSIS — Z79899 Other long term (current) drug therapy: Secondary | ICD-10-CM | POA: Diagnosis not present

## 2019-01-05 DIAGNOSIS — M5410 Radiculopathy, site unspecified: Secondary | ICD-10-CM | POA: Diagnosis not present

## 2019-01-05 DIAGNOSIS — Z6824 Body mass index (BMI) 24.0-24.9, adult: Secondary | ICD-10-CM | POA: Diagnosis not present

## 2019-01-05 DIAGNOSIS — M3320 Polymyositis, organ involvement unspecified: Secondary | ICD-10-CM | POA: Diagnosis not present

## 2019-01-05 DIAGNOSIS — M546 Pain in thoracic spine: Secondary | ICD-10-CM | POA: Diagnosis not present

## 2019-01-21 ENCOUNTER — Other Ambulatory Visit (HOSPITAL_COMMUNITY): Payer: Self-pay | Admitting: *Deleted

## 2019-01-21 MED ORDER — LORATADINE 10 MG PO TABS: 10.0000 mg | ORAL_TABLET | Freq: Every day | ORAL | Status: DC

## 2019-01-22 ENCOUNTER — Ambulatory Visit (HOSPITAL_COMMUNITY)
Admission: RE | Admit: 2019-01-22 | Discharge: 2019-01-22 | Disposition: A | Discharge status: Home / Self Care | Payer: BLUE CROSS/BLUE SHIELD | Source: Ambulatory Visit | Attending: Rheumatology | Admitting: Rheumatology

## 2019-01-22 ENCOUNTER — Other Ambulatory Visit: Payer: Self-pay

## 2019-01-22 DIAGNOSIS — M0579 Rheumatoid arthritis with rheumatoid factor of multiple sites without organ or systems involvement: Secondary | ICD-10-CM | POA: Diagnosis not present

## 2019-01-22 DIAGNOSIS — M3320 Polymyositis, organ involvement unspecified: Secondary | ICD-10-CM | POA: Diagnosis not present

## 2019-01-22 MED ORDER — METHYLPREDNISOLONE SODIUM SUCC 125 MG IJ SOLR
INTRAMUSCULAR | Status: AC
  Filled 2019-01-22: qty 2

## 2019-01-22 MED ORDER — ACETAMINOPHEN 325 MG PO TABS
650.0000 mg | ORAL_TABLET | ORAL | Status: DC
  Administered 2019-01-22: 650 mg via ORAL

## 2019-01-22 MED ORDER — METHYLPREDNISOLONE SODIUM SUCC 125 MG IJ SOLR
100.0000 mg | INTRAMUSCULAR | Status: DC
  Administered 2019-01-22: 100 mg via INTRAVENOUS

## 2019-01-22 MED ORDER — LORATADINE 10 MG PO TABS: 10.0000 mg | ORAL_TABLET | ORAL | Status: DC

## 2019-01-22 MED ORDER — ACETAMINOPHEN 325 MG PO TABS
ORAL_TABLET | ORAL | Status: AC
  Filled 2019-01-22: qty 2

## 2019-01-22 MED ORDER — CETIRIZINE HCL 10 MG PO TABS
ORAL_TABLET | ORAL | Status: AC
  Administered 2019-01-22: 10 mg
  Filled 2019-01-22: qty 1

## 2019-01-22 MED ORDER — SODIUM CHLORIDE 0.9 % IV SOLN
1000.0000 mg | INTRAVENOUS | Status: DC
  Administered 2019-01-22: 1000 mg via INTRAVENOUS
  Filled 2019-01-22: qty 100

## 2019-01-22 NOTE — Discharge Instructions (Signed)
Rituximab injection What is this medicine? RITUXIMAB (ri TUX i mab) is a monoclonal antibody. It is used to treat certain types of cancer like non-Hodgkin lymphoma and chronic lymphocytic leukemia. It is also used to treat rheumatoid arthritis, granulomatosis with polyangiitis (or Wegener's granulomatosis), microscopic polyangiitis, and pemphigus vulgaris. This medicine may be used for other purposes; ask your health care provider or pharmacist if you have questions. COMMON BRAND NAME(S): Rituxan, RUXIENCE What should I tell my health care provider before I take this medicine? They need to know if you have any of these conditions:  heart disease  infection (especially a virus infection such as hepatitis B, chickenpox, cold sores, or herpes)  immune system problems  irregular heartbeat  kidney disease  low blood counts, like low white cell, platelet, or red cell counts  lung or breathing disease, like asthma  recently received or scheduled to receive a vaccine  an unusual or allergic reaction to rituximab, other medicines, foods, dyes, or preservatives  pregnant or trying to get pregnant  breast-feeding How should I use this medicine? This medicine is for infusion into a vein. It is administered in a hospital or clinic by a specially trained health care professional. A special MedGuide will be given to you by the pharmacist with each prescription and refill. Be sure to read this information carefully each time. Talk to your pediatrician regarding the use of this medicine in children. This medicine is not approved for use in children. Overdosage: If you think you have taken too much of this medicine contact a poison control center or emergency room at once. NOTE: This medicine is only for you. Do not share this medicine with others. What if I miss a dose? It is important not to miss a dose. Call your doctor or health care professional if you are unable to keep an appointment. What  may interact with this medicine?  cisplatin  live virus vaccines This list may not describe all possible interactions. Give your health care provider a list of all the medicines, herbs, non-prescription drugs, or dietary supplements you use. Also tell them if you smoke, drink alcohol, or use illegal drugs. Some items may interact with your medicine. What should I watch for while using this medicine? Your condition will be monitored carefully while you are receiving this medicine. You may need blood work done while you are taking this medicine. This medicine can cause serious allergic reactions. To reduce your risk you may need to take medicine before treatment with this medicine. Take your medicine as directed. In some patients, this medicine may cause a serious brain infection that may cause death. If you have any problems seeing, thinking, speaking, walking, or standing, tell your healthcare professional right away. If you cannot reach your healthcare professional, urgently seek other source of medical care. Call your doctor or health care professional for advice if you get a fever, chills or sore throat, or other symptoms of a cold or flu. Do not treat yourself. This drug decreases your body's ability to fight infections. Try to avoid being around people who are sick. Do not become pregnant while taking this medicine or for at least 12 months after stopping it. Women should inform their doctor if they wish to become pregnant or think they might be pregnant. There is a potential for serious side effects to an unborn child. Talk to your health care professional or pharmacist for more information. Do not breast-feed an infant while taking this medicine or for at   least 6 months after stopping it. What side effects may I notice from receiving this medicine? Side effects that you should report to your doctor or health care professional as soon as possible:  allergic reactions like skin rash, itching or  hives; swelling of the face, lips, or tongue  breathing problems  chest pain  changes in vision  diarrhea  headache with fever, neck stiffness, sensitivity to light, nausea, or confusion  fast, irregular heartbeat  loss of memory  low blood counts - this medicine may decrease the number of white blood cells, red blood cells and platelets. You may be at increased risk for infections and bleeding.  mouth sores  problems with balance, talking, or walking  redness, blistering, peeling or loosening of the skin, including inside the mouth  signs of infection - fever or chills, cough, sore throat, pain or difficulty passing urine  signs and symptoms of kidney injury like trouble passing urine or change in the amount of urine  signs and symptoms of liver injury like dark yellow or brown urine; general ill feeling or flu-like symptoms; light-colored stools; loss of appetite; nausea; right upper belly pain; unusually weak or tired; yellowing of the eyes or skin  signs and symptoms of low blood pressure like dizziness; feeling faint or lightheaded, falls; unusually weak or tired  stomach pain  swelling of the ankles, feet, hands  unusual bleeding or bruising  vomiting Side effects that usually do not require medical attention (report to your doctor or health care professional if they continue or are bothersome):  headache  joint pain  muscle cramps or muscle pain  nausea  tiredness This list may not describe all possible side effects. Call your doctor for medical advice about side effects. You may report side effects to FDA at 1-800-FDA-1088. Where should I keep my medicine? This drug is given in a hospital or clinic and will not be stored at home. NOTE: This sheet is a summary. It may not cover all possible information. If you have questions about this medicine, talk to your doctor, pharmacist, or health care provider.  2020 Elsevier/Gold Standard (2018-07-22  22:01:36)  

## 2019-03-09 DIAGNOSIS — Z79899 Other long term (current) drug therapy: Secondary | ICD-10-CM | POA: Diagnosis not present

## 2019-03-09 DIAGNOSIS — M2550 Pain in unspecified joint: Secondary | ICD-10-CM | POA: Diagnosis not present

## 2019-03-09 DIAGNOSIS — M0579 Rheumatoid arthritis with rheumatoid factor of multiple sites without organ or systems involvement: Secondary | ICD-10-CM | POA: Diagnosis not present

## 2019-03-09 DIAGNOSIS — M3320 Polymyositis, organ involvement unspecified: Secondary | ICD-10-CM | POA: Diagnosis not present

## 2019-03-09 DIAGNOSIS — M545 Low back pain: Secondary | ICD-10-CM | POA: Diagnosis not present

## 2019-03-09 DIAGNOSIS — R29898 Other symptoms and signs involving the musculoskeletal system: Secondary | ICD-10-CM | POA: Diagnosis not present

## 2019-03-18 DIAGNOSIS — K754 Autoimmune hepatitis: Secondary | ICD-10-CM | POA: Diagnosis not present

## 2019-03-22 DIAGNOSIS — K754 Autoimmune hepatitis: Secondary | ICD-10-CM | POA: Diagnosis not present

## 2019-03-26 ENCOUNTER — Other Ambulatory Visit: Payer: Self-pay | Admitting: Rheumatology

## 2019-03-26 DIAGNOSIS — M545 Low back pain, unspecified: Secondary | ICD-10-CM

## 2019-04-05 DIAGNOSIS — M0579 Rheumatoid arthritis with rheumatoid factor of multiple sites without organ or systems involvement: Secondary | ICD-10-CM | POA: Diagnosis not present

## 2019-04-05 DIAGNOSIS — Z79899 Other long term (current) drug therapy: Secondary | ICD-10-CM | POA: Diagnosis not present

## 2019-04-13 ENCOUNTER — Ambulatory Visit
Admission: RE | Admit: 2019-04-13 | Discharge: 2019-04-13 | Disposition: A | Discharge status: Home / Self Care | Payer: Medicare Other | Source: Ambulatory Visit | Attending: Rheumatology | Admitting: Rheumatology

## 2019-04-13 ENCOUNTER — Other Ambulatory Visit: Payer: Self-pay | Admitting: Rheumatology

## 2019-04-13 ENCOUNTER — Other Ambulatory Visit: Payer: Self-pay

## 2019-04-13 DIAGNOSIS — M5127 Other intervertebral disc displacement, lumbosacral region: Secondary | ICD-10-CM | POA: Diagnosis not present

## 2019-04-13 DIAGNOSIS — M545 Low back pain, unspecified: Secondary | ICD-10-CM

## 2019-05-18 DIAGNOSIS — Z1231 Encounter for screening mammogram for malignant neoplasm of breast: Secondary | ICD-10-CM | POA: Diagnosis not present

## 2019-08-03 DIAGNOSIS — M2550 Pain in unspecified joint: Secondary | ICD-10-CM | POA: Diagnosis not present

## 2019-08-03 DIAGNOSIS — Z6825 Body mass index (BMI) 25.0-25.9, adult: Secondary | ICD-10-CM | POA: Diagnosis not present

## 2019-08-03 DIAGNOSIS — R29898 Other symptoms and signs involving the musculoskeletal system: Secondary | ICD-10-CM | POA: Diagnosis not present

## 2019-08-03 DIAGNOSIS — E663 Overweight: Secondary | ICD-10-CM | POA: Diagnosis not present

## 2019-08-03 DIAGNOSIS — M0579 Rheumatoid arthritis with rheumatoid factor of multiple sites without organ or systems involvement: Secondary | ICD-10-CM | POA: Diagnosis not present

## 2019-08-03 DIAGNOSIS — Z79899 Other long term (current) drug therapy: Secondary | ICD-10-CM | POA: Diagnosis not present

## 2019-08-03 DIAGNOSIS — M3320 Polymyositis, organ involvement unspecified: Secondary | ICD-10-CM | POA: Diagnosis not present

## 2019-09-13 DIAGNOSIS — K754 Autoimmune hepatitis: Secondary | ICD-10-CM | POA: Diagnosis not present

## 2019-10-27 DIAGNOSIS — Z79899 Other long term (current) drug therapy: Secondary | ICD-10-CM | POA: Diagnosis not present

## 2019-10-27 DIAGNOSIS — Z789 Other specified health status: Secondary | ICD-10-CM | POA: Diagnosis not present

## 2019-10-27 DIAGNOSIS — Z9119 Patient's noncompliance with other medical treatment and regimen: Secondary | ICD-10-CM | POA: Diagnosis not present

## 2019-10-27 DIAGNOSIS — M0579 Rheumatoid arthritis with rheumatoid factor of multiple sites without organ or systems involvement: Secondary | ICD-10-CM | POA: Diagnosis not present

## 2019-10-27 DIAGNOSIS — Z6824 Body mass index (BMI) 24.0-24.9, adult: Secondary | ICD-10-CM | POA: Diagnosis not present

## 2019-10-27 DIAGNOSIS — M2550 Pain in unspecified joint: Secondary | ICD-10-CM | POA: Diagnosis not present

## 2019-10-27 DIAGNOSIS — E663 Overweight: Secondary | ICD-10-CM | POA: Diagnosis not present

## 2019-10-27 DIAGNOSIS — M3320 Polymyositis, organ involvement unspecified: Secondary | ICD-10-CM | POA: Diagnosis not present

## 2019-10-27 DIAGNOSIS — R29898 Other symptoms and signs involving the musculoskeletal system: Secondary | ICD-10-CM | POA: Diagnosis not present

## 2019-11-06 ENCOUNTER — Ambulatory Visit (HOSPITAL_COMMUNITY)
Admission: EM | Admit: 2019-11-06 | Discharge: 2019-11-06 | Disposition: A | Discharge status: Home / Self Care | Payer: Medicare Other | Attending: Family Medicine | Admitting: Family Medicine

## 2019-11-06 ENCOUNTER — Other Ambulatory Visit: Payer: Self-pay

## 2019-11-06 ENCOUNTER — Encounter (HOSPITAL_COMMUNITY): Payer: Self-pay

## 2019-11-06 DIAGNOSIS — M25572 Pain in left ankle and joints of left foot: Secondary | ICD-10-CM

## 2019-11-06 LAB — CBC WITH DIFFERENTIAL/PLATELET
Abs Immature Granulocytes: 0.03 10*3/uL (ref 0.00–0.07)
Basophils Absolute: 0.1 10*3/uL (ref 0.0–0.1)
Basophils Relative: 1 %
Eosinophils Absolute: 0.3 10*3/uL (ref 0.0–0.5)
Eosinophils Relative: 3 %
HCT: 45.2 % (ref 36.0–46.0)
Hemoglobin: 14.7 g/dL (ref 12.0–15.0)
Immature Granulocytes: 0 %
Lymphocytes Relative: 14 %
Lymphs Abs: 1.2 10*3/uL (ref 0.7–4.0)
MCH: 32.5 pg (ref 26.0–34.0)
MCHC: 32.5 g/dL (ref 30.0–36.0)
MCV: 99.8 fL (ref 80.0–100.0)
Monocytes Absolute: 1.1 10*3/uL — ABNORMAL HIGH (ref 0.1–1.0)
Monocytes Relative: 13 %
Neutro Abs: 5.9 10*3/uL (ref 1.7–7.7)
Neutrophils Relative %: 69 %
Platelets: 221 10*3/uL (ref 150–400)
RBC: 4.53 MIL/uL (ref 3.87–5.11)
RDW: 14 % (ref 11.5–15.5)
WBC: 8.5 10*3/uL (ref 4.0–10.5)
nRBC: 0 % (ref 0.0–0.2)

## 2019-11-06 LAB — URIC ACID: Uric Acid, Serum: 2.8 mg/dL (ref 2.5–7.1)

## 2019-11-06 MED ORDER — INDOMETHACIN 50 MG PO CAPS: ORAL_CAPSULE | ORAL | 0 refills | Status: DC

## 2019-11-06 MED ORDER — MELOXICAM 7.5 MG PO TABS: 7.5000 mg | ORAL_TABLET | Freq: Every day | ORAL | 0 refills | Status: DC

## 2019-11-06 NOTE — ED Provider Notes (Signed)
[facility]   [id number] 11/06/19 Arrival Time: 1210  [id number] PAIN  SUBJECTIVE: History from: patient and family. Tina Gray is a 54 y.o. female complains of left ankle pain that began yesterday. Reports that this has occurred in the past and that this is the 4th occurrence of pain, redness and swelling over her inner ankle. Denies a precipitating event or specific injury.  Localizes the pain to the L medial ankle.  Describes the pain as intermittent and achy in character. Has tried OTC medications without relief. Symptoms are made worse with activity.  Denies fever, chills, ecchymosis, effusion, weakness, numbness and tingling, saddle paresthesias, loss of bowel or bladder function.      ROS: As per HPI.  All other pertinent ROS negative.     Past Medical History:  Diagnosis Date  . Carpal tunnel syndrome   . Chest pain   . Deaf   . GERD (gastroesophageal reflux disease)   . Hypertension    Past Surgical History:  Procedure Laterality Date  . ESOPHAGEAL MANOMETRY N/A 03/13/2016   Procedure: ESOPHAGEAL MANOMETRY (EM);  Surgeon: Doran Stabler, MD;  Location: [facility];  Service: Gastroenterology;  Laterality: N/A;  . LAPAROSCOPIC OVARIAN CYSTECTOMY     No Known Allergies Current Facility-Administered Medications on File Prior to Encounter  Medication Dose Route Frequency Provider Last Rate Last Admin  . loratadine (CLARITIN) tablet 10 mg  10 mg Oral Daily Gavin Pound, MD       Current Outpatient Medications on File Prior to Encounter  Medication Sig Dispense Refill  . etanercept (ENBREL SURECLICK) 50 MG/ML injection Inject 50 mg as directed every Wednesday.    Marland Kitchen HYDROcodone-acetaminophen (NORCO/VICODIN) 5-325 MG tablet Take 1 tablet by mouth every 4 (four) hours as needed. 14 tablet 0  . ibuprofen (ADVIL,MOTRIN) 200 MG tablet Take 400 mg by mouth daily as needed for headache.    . loratadine (CLARITIN) 10 MG tablet Take 1 tablet (10 mg total) by mouth daily.  30 tablet 0  . Multiple Vitamin (MULTIVITAMIN WITH MINERALS) TABS tablet Take 1 tablet by mouth daily.    . sucralfate (CARAFATE) 1 g tablet Take 1 g by mouth 2 (two) times daily.     Social History   Socioeconomic History  . Marital status: Legally Separated    Spouse name: Not on file  . Number of children: 2  . Years of education: Not on file  . Highest education level: Not on file  Occupational History  . Occupation: [employer]  Tobacco Use  . Smoking status: Never Smoker  . Smokeless tobacco: Never Used  Substance and Sexual Activity  . Alcohol use: Yes    Comment: rare wine 2-3 month  . Drug use: No  . Sexual activity: Yes    Birth control/protection: None  Other Topics Concern  . Not on file  Social History Narrative  . Not on file   Social Determinants of Health   Financial Resource Strain:   . Difficulty of Paying Living Expenses:   Food Insecurity:   . Worried About Charity fundraiser in the Last Year:   . Arboriculturist in the Last Year:   Transportation Needs:   . Film/video editor (Medical):   Marland Kitchen Lack of Transportation (Non-Medical):   Physical Activity:   . Days of Exercise per Week:   . Minutes of Exercise per Session:   Stress:   . Feeling of Stress :   Social Connections:   .  Frequency of Communication with Friends and Family:   . Frequency of Social Gatherings with Friends and Family:   . Attends Religious Services:   . Active Member of Clubs or Organizations:   . Attends Archivist Meetings:   Marland Kitchen Marital Status:   Intimate Partner Violence:   . Fear of Current or Ex-Partner:   . Emotionally Abused:   Marland Kitchen Physically Abused:   . Sexually Abused:    Family History  Problem Relation Age of Onset  . Heart attack Mother   . Heart disease Mother   . Cancer Father   . Esophageal cancer Father   . Colon cancer Neg Hx   . Rectal cancer Neg Hx   . Stomach cancer Neg Hx     OBJECTIVE:  Vitals:   11/06/19 1323  BP: (!)  181/84  Pulse: 60  Resp: 18  Temp: 99.9 F (37.7 C)  TempSrc: Oral  SpO2: 99%    General appearance: ALERT; in no acute distress.  Head: NCAT Lungs: Normal respiratory effort CV: Pedal pulses 2+ bilaterally. Cap refill < 2 seconds Musculoskeletal:  Inspection: Skin warm, dry, clear and intact without obvious erythema, effusion, or ecchymosis.  Palpation: L medial aspect of the ankle erythematous, mildly swollen, warm to touch and very TTP ROM: FROM active and passive Strength: 5/5 shld abduction, 5/5 shld adduction, 5/5 elbow flexion, 5/5 elbow extension, 5/5 grip strength, 5/5 hip flexion, 5/5 knee abduction, 5/5 knee adduction, 5/5 knee flexion, 5/5 knee extension, 5/5 dorsiflexion, 5/5 plantar flexion Stability: Anterior/ posterior drawer intact Skin: warm and dry Neurologic: Ambulates without difficulty; Sensation intact about the upper/ lower extremities Psychological: alert and cooperative; normal mood and affect  DIAGNOSTIC STUDIES:  No results found.   ASSESSMENT & PLAN:  1. Acute left ankle pain       Meds ordered this encounter  Medications  . meloxicam (MOBIC) 7.5 MG tablet    Sig: Take 1 tablet (7.5 mg total) by mouth daily.    Dispense:  30 tablet    Refill:  0    Order Specific Question:   Supervising Provider    Answer:   Chase Picket [id number]  . indomethacin (INDOCIN) 50 MG capsule    Sig: Take one tablet with lunch and one tablet with dinner today, then take one tablet daily until gout resolves    Dispense:  10 capsule    Refill:  0    Order Specific Question:   Supervising Provider    Answer:   Chase Picket [id number]      Gout vs Cellulitis Prescribed meloxicam 7.5mg , do not take ibuprofen with this medication. Prescribed indomethacin 50mg  Continue conservative management of rest, ice, and gentle stretches Follow up with PCP if symptoms persist Return or go to the ER if you have any new or worsening symptoms (fever, chills, chest  pain, abdominal pain, changes in bowel or bladder habits, pain radiating into lower legs.  CBC and uric acid ordered. Will follow up with abnormal results, normal results will be available via MyChart  Reviewed expectations re: course of current medical issues. Questions answered. Outlined signs and symptoms indicating need for more acute intervention. Patient verbalized understanding. After Visit Summary given.       Faustino Congress, NP 11/06/19 1405

## 2019-11-06 NOTE — Discharge Instructions (Addendum)
Possible gout, possible infection  We will let you know about your blood results tomorrow when they are back.  Results will also be available via MyChart.  Medications will be ready for you at your pharmacy.

## 2019-11-06 NOTE — ED Triage Notes (Signed)
Pt present pain in both of her ankle, pt states she can feel both ankles palpation and causing a severe pain. Pt denies any injury to her ankles

## 2019-11-09 DIAGNOSIS — H1132 Conjunctival hemorrhage, left eye: Secondary | ICD-10-CM | POA: Diagnosis not present

## 2019-11-11 IMAGING — MR MR FEMUR*L* W/O CM
5 series · 40 of 40 positions shown · non-contrast
Comparison: None.

CLINICAL DATA: Weakness in both extremities since [DATE]. No
known injury.

EXAM:
MR OF THE LEFT FEMUR WITHOUT CONTRAST
TECHNIQUE: Multiplanar, multisequence MR imaging of the left femur was
performed. No intravenous contrast was administered.

[Series 4: T1 · axial · 6.0mm · 1.04mm/px · z∈[-197,+155]mm · 9 of 48 slices shown (1 of 2)]
[im 1/48]
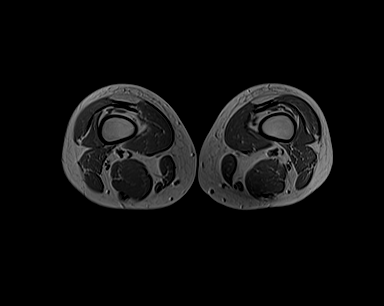
[im 6/48]
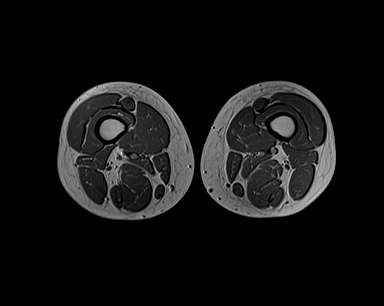
[im 12/48]
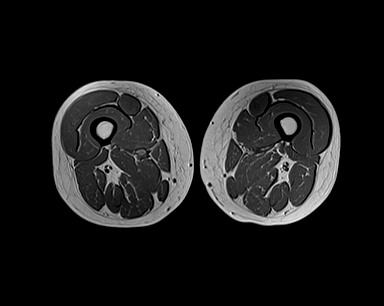
[im 18/48]
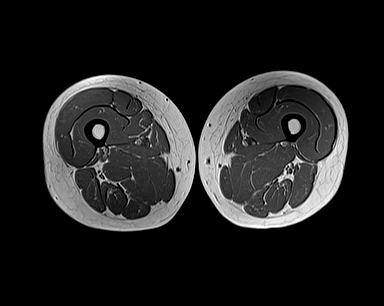
[im 24/48]
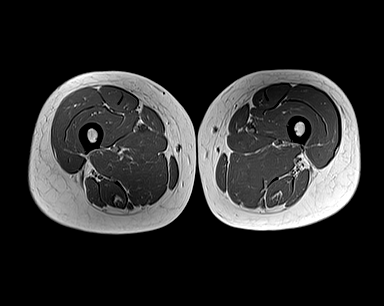
[im 30/48]
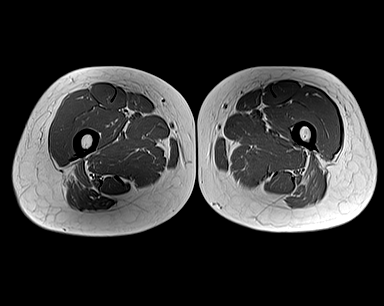
[im 36/48]
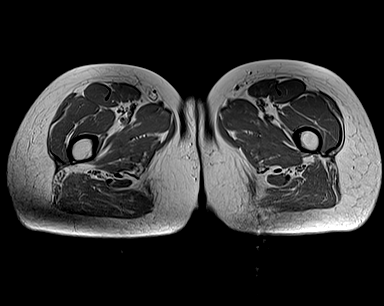
[im 42/48]
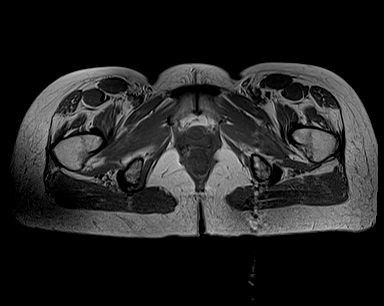
[im 48/48]
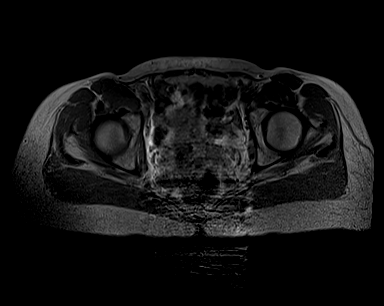

[Series 5: T2 fat-sat · axial · 6.0mm · 1.56mm/px · z∈[-234,+192]mm · 13 of 58 slices shown (1 of 3)]
[im 1/58]
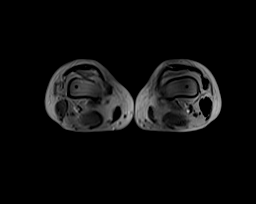
[im 5/58]
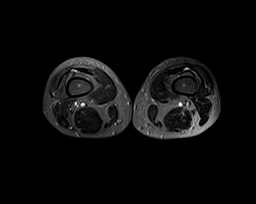
[im 10/58]
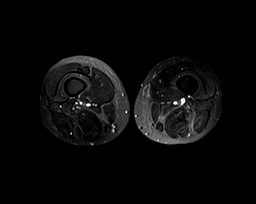
[im 15/58]
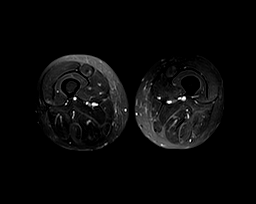
[im 20/58]
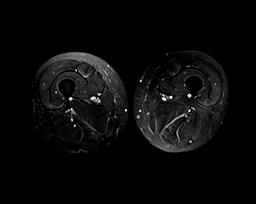
[im 24/58]
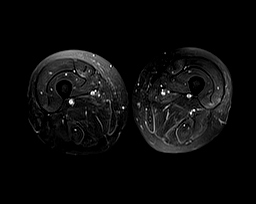
[im 29/58]
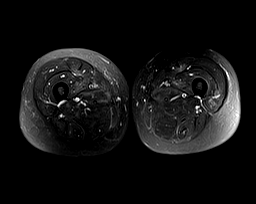
[im 34/58]
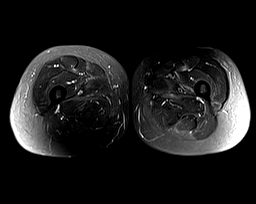
[im 39/58]
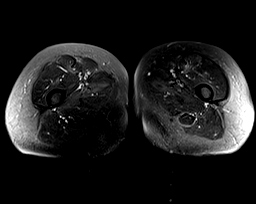
[im 43/58]
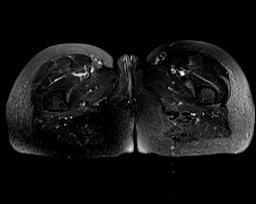
[im 48/58]
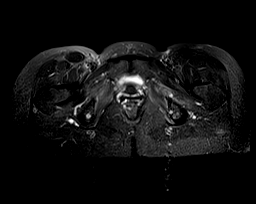
[im 53/58]
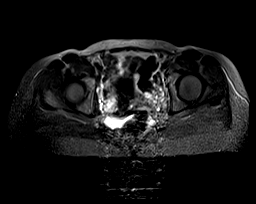
[im 58/58]
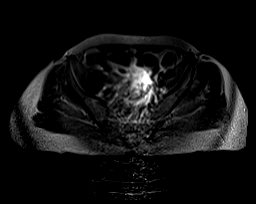

[Series 6: T1 · sagittal · 6.0mm · 0.73mm/px · 6 of 26 slices shown (2 of 2)]
[im 1/26]
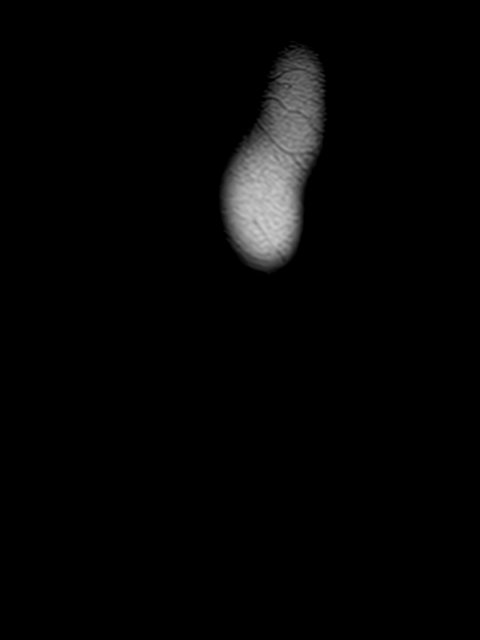
[im 6/26]
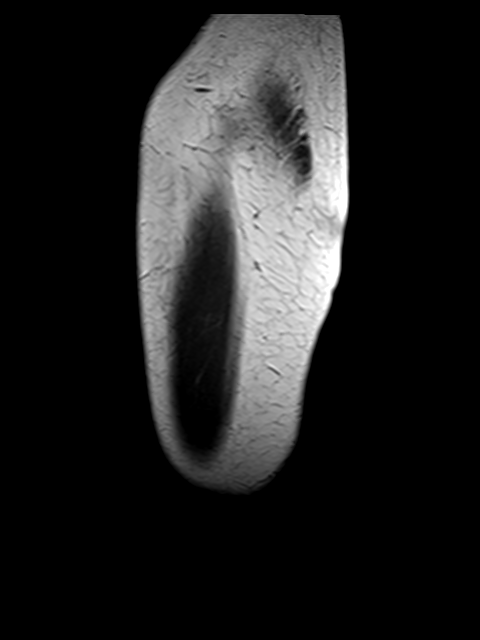
[im 11/26]
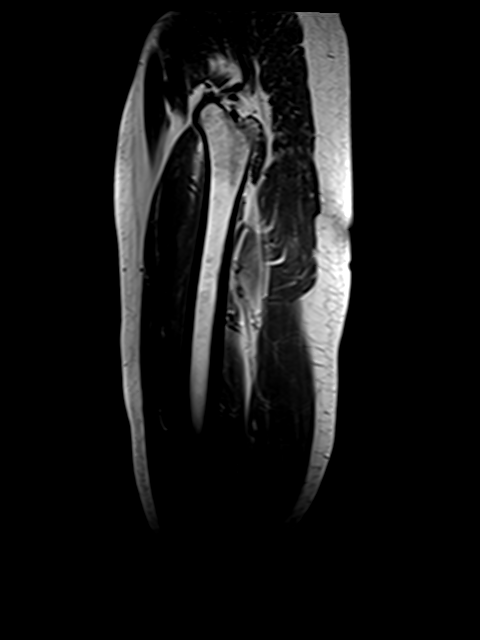
[im 16/26]
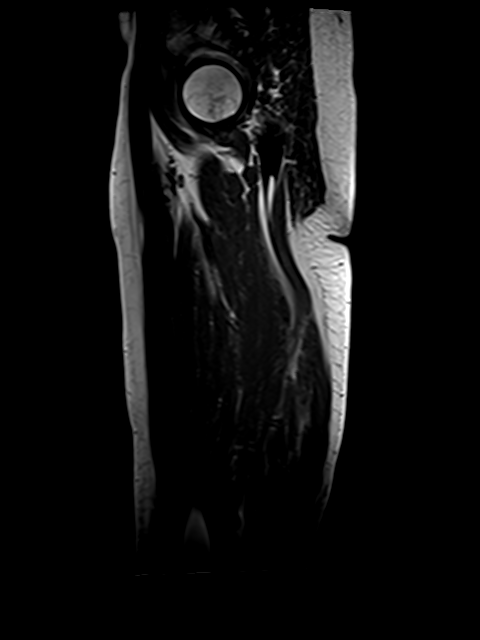
[im 21/26]
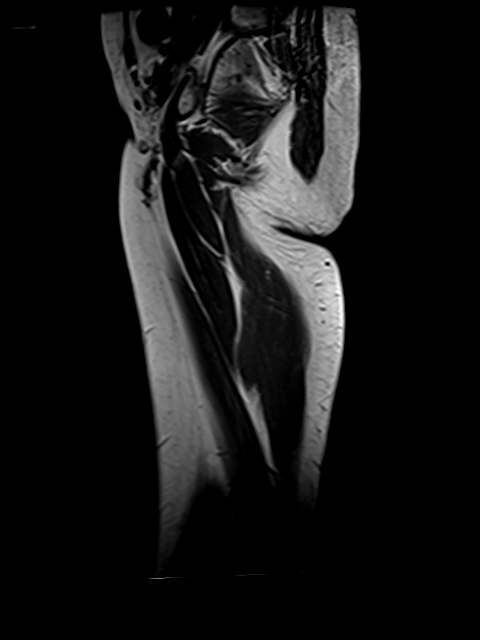
[im 26/26]
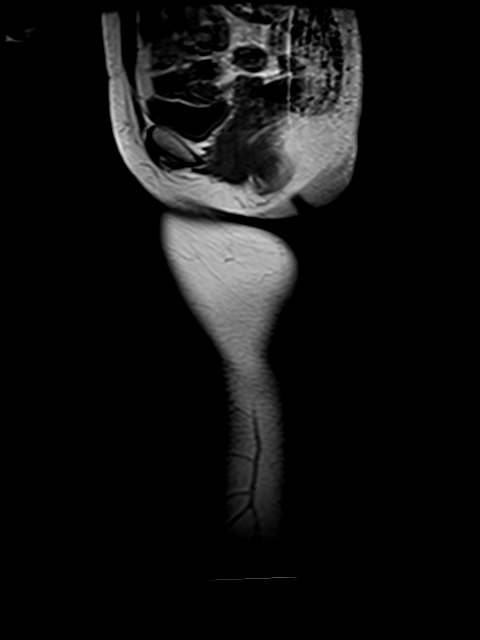

[Series 7: T2 fat-sat · sagittal · 6.0mm · 1.95mm/px · 6 of 26 slices shown (2 of 3)]
[im 1/26]
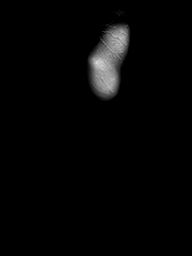
[im 6/26]
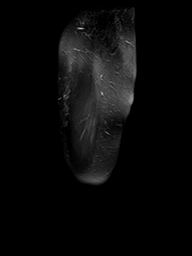
[im 11/26]
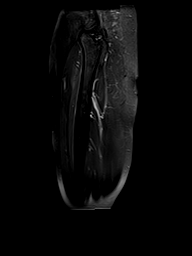
[im 16/26]
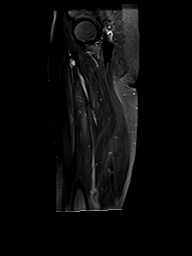
[im 21/26]
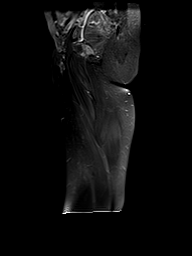
[im 26/26]
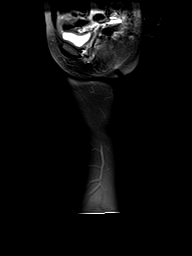

[Series 8: T2 fat-sat · coronal · 6.0mm · 1.47mm/px · 6 of 27 slices shown (3 of 3)]
[im 1/27]
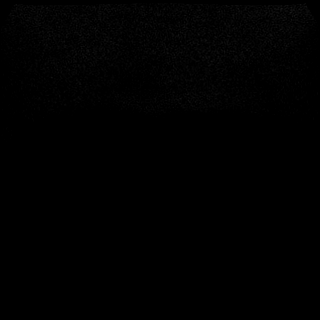
[im 6/27]
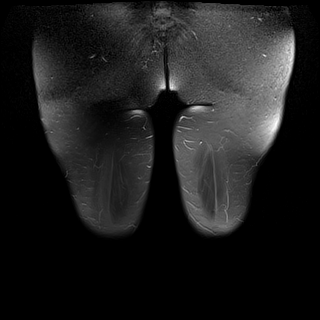
[im 11/27]
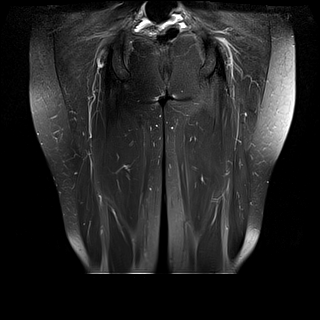
[im 16/27]
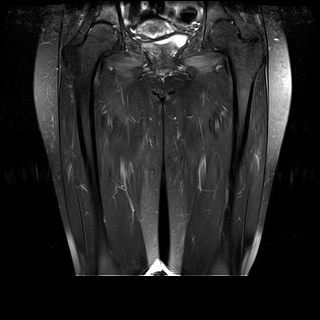
[im 21/27]
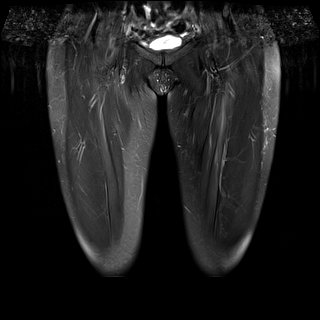
[im 27/27]
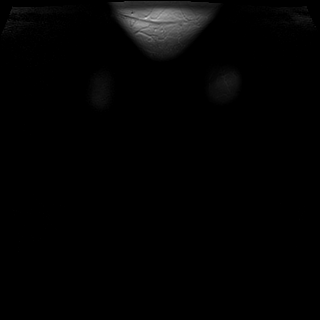

[40 of 40 positions shown; findings below may reference images not displayed]

FINDINGS: Bones/Joint/Cartilage

No marrow signal abnormality. No fracture or dislocation. Normal
alignment. No joint effusion.

Ligaments, Muscles and Tendons
Mild muscle edema in the rectus femoris, vastus medialis, vastus
intermedius and peripheral aspect of the vastus lateralis muscle
bilaterally. Mild muscle edema in the obturator externus muscle
bilaterally.

Soft tissue
No fluid collection or hematoma.  No soft tissue mass.
IMPRESSION: 1. Mild muscle edema in the obturator externus muscles and to lesser
extent rectus femoris, vastus medialis, vastus intermedius and
peripheral aspect of the vastus lateralis muscle bilaterally
concerning for myositis likely secondary to an inflammatory
etiology. No intramuscular fluid collection or hematoma.

## 2019-11-18 DIAGNOSIS — R918 Other nonspecific abnormal finding of lung field: Secondary | ICD-10-CM | POA: Diagnosis not present

## 2019-12-01 DIAGNOSIS — M545 Low back pain: Secondary | ICD-10-CM | POA: Diagnosis not present

## 2019-12-01 DIAGNOSIS — M069 Rheumatoid arthritis, unspecified: Secondary | ICD-10-CM | POA: Diagnosis not present

## 2019-12-01 DIAGNOSIS — I10 Essential (primary) hypertension: Secondary | ICD-10-CM | POA: Diagnosis not present

## 2020-03-02 DIAGNOSIS — R05 Cough: Secondary | ICD-10-CM | POA: Diagnosis not present

## 2020-03-02 DIAGNOSIS — Z1159 Encounter for screening for other viral diseases: Secondary | ICD-10-CM | POA: Diagnosis not present

## 2020-09-19 DIAGNOSIS — R634 Abnormal weight loss: Secondary | ICD-10-CM | POA: Diagnosis not present

## 2020-09-19 DIAGNOSIS — R531 Weakness: Secondary | ICD-10-CM | POA: Diagnosis not present

## 2020-09-19 DIAGNOSIS — R52 Pain, unspecified: Secondary | ICD-10-CM | POA: Diagnosis not present

## 2020-09-19 DIAGNOSIS — M6281 Muscle weakness (generalized): Secondary | ICD-10-CM | POA: Diagnosis not present

## 2020-09-19 DIAGNOSIS — R21 Rash and other nonspecific skin eruption: Secondary | ICD-10-CM | POA: Diagnosis not present

## 2020-09-19 DIAGNOSIS — E559 Vitamin D deficiency, unspecified: Secondary | ICD-10-CM | POA: Diagnosis not present

## 2020-09-19 DIAGNOSIS — R109 Unspecified abdominal pain: Secondary | ICD-10-CM | POA: Diagnosis not present

## 2020-09-19 DIAGNOSIS — M069 Rheumatoid arthritis, unspecified: Secondary | ICD-10-CM | POA: Diagnosis not present

## 2020-09-19 DIAGNOSIS — R079 Chest pain, unspecified: Secondary | ICD-10-CM | POA: Diagnosis not present

## 2020-09-19 DIAGNOSIS — M791 Myalgia, unspecified site: Secondary | ICD-10-CM | POA: Diagnosis not present

## 2020-09-26 DIAGNOSIS — R059 Cough, unspecified: Secondary | ICD-10-CM | POA: Diagnosis not present

## 2020-09-26 DIAGNOSIS — R109 Unspecified abdominal pain: Secondary | ICD-10-CM | POA: Diagnosis not present

## 2020-10-12 DIAGNOSIS — M069 Rheumatoid arthritis, unspecified: Secondary | ICD-10-CM | POA: Diagnosis not present

## 2020-10-12 DIAGNOSIS — J8410 Pulmonary fibrosis, unspecified: Secondary | ICD-10-CM | POA: Diagnosis not present

## 2020-11-28 ENCOUNTER — Ambulatory Visit (INDEPENDENT_AMBULATORY_CARE_PROVIDER_SITE_OTHER): Payer: Medicare Other | Admitting: Pulmonary Disease

## 2020-11-28 ENCOUNTER — Encounter: Payer: Self-pay | Admitting: Pulmonary Disease

## 2020-11-28 ENCOUNTER — Other Ambulatory Visit: Payer: Self-pay

## 2020-11-28 ENCOUNTER — Institutional Professional Consult (permissible substitution): Payer: Medicare Other | Admitting: Internal Medicine

## 2020-11-28 VITALS — BP 120/76 | HR 67 | Temp 98.0°F | Ht 66.0 in | Wt 142.0 lb

## 2020-11-28 DIAGNOSIS — J84112 Idiopathic pulmonary fibrosis: Secondary | ICD-10-CM | POA: Diagnosis not present

## 2020-11-28 DIAGNOSIS — M069 Rheumatoid arthritis, unspecified: Secondary | ICD-10-CM | POA: Diagnosis not present

## 2020-11-28 DIAGNOSIS — I10 Essential (primary) hypertension: Secondary | ICD-10-CM

## 2020-11-28 DIAGNOSIS — J849 Interstitial pulmonary disease, unspecified: Secondary | ICD-10-CM | POA: Diagnosis not present

## 2020-11-28 MED ORDER — OLMESARTAN MEDOXOMIL 20 MG PO TABS: 20.0000 mg | ORAL_TABLET | Freq: Every day | ORAL | 3 refills | Status: AC

## 2020-11-28 NOTE — Assessment & Plan Note (Signed)
Her ILD dates back to 2017 and predates COVID.  This is more likely related to CT ILD , certainly could be rheumatoid lung.   We will obtain PFTs to quantify her lung function.  Lack of desaturation on ambulation is reassuring

## 2020-11-28 NOTE — Patient Instructions (Addendum)
  You have scarring in your lungs which may be related to rheumatoid arthritis   Amb sat Schedule pFTs  STOP taking Lisinopril 20  Start taking benicar 20 INSTEAD , check your BP daily for next few days   Get back with your rheumatologist & GI doctor Get records from rheum/ GI

## 2020-11-28 NOTE — Assessment & Plan Note (Signed)
I have asked her to connect back with rheumatology again.  She is maintained on azathioprine and prednisone but she still has some symptoms of arthralgias and joint swellings so may need escalation of her medications.  She also has some skin lesions, which raises the question of scleroderma or other connective tissue disease

## 2020-11-28 NOTE — Progress Notes (Signed)
Subjective:    Patient ID: Tina Gray, female    DOB: Nov 19, 1965, 55 y.o.   MRN: [id number]  HPI  55 year old deaf, never smoker presents for evaluation of ILD. PCP - Jonelle Sidle Rheum - [facility] Therisa Doyne, [place]  She is accompanied by her [facility] POA, niece, son and son language interpreter.  She developed weight loss for the past few months and CT chest/abdomen was performed which showed bibasilar reticulation with groundglass and traction bronchiectasis, hence she is referred.  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  She was maintained on Enbrel but this was stopped and currently she is on Imuran 100 mg daily and prednisone 5 mg.  She has not seen rheumatology in more than a year during the pandemic.  She reports increased arthritis symptoms in her hands and knees. -There is mention of autoimmune hepatitis and she has a liver biopsy from 2018 that was nondiagnostic, she has seen GI Dr. Therisa Doyne.  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  -Hypertension is controlled on lisinopril and amlodipine  -History of COVID infection 09/2019 and February 2022  Her main symptom has shortness of breath especially when climbing stairs.  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 Carpal tunnel syndrome   . Chest pain   . Deaf   . GERD (gastroesophageal reflux disease)   . Hypertension    Past Surgical History:  Procedure Laterality Date  . ESOPHAGEAL MANOMETRY N/A 03/13/2016   Procedure: ESOPHAGEAL MANOMETRY (EM);  Surgeon: Doran Stabler, MD;  Location: [facility];  Service: Gastroenterology;  Laterality: N/A;  . LAPAROSCOPIC OVARIAN CYSTECTOMY      No Known Allergies  Social History   Socioeconomic History  . Marital status: Legally Separated    Spouse name: Not on file  . Number of children: 2  . Years of education: Not on file  . Highest education level: Not on file  Occupational History  . Occupation: [employer]  Tobacco Use  . Smoking status: Never Smoker  . Smokeless tobacco: Never Used  Substance and Sexual Activity  . Alcohol use: Yes    Comment: rare wine 2-3 month  . Drug use: No  . Sexual activity: Yes    Birth control/protection: None  Other Topics Concern  . Not on file  Social History Narrative  . Not on file   Social Determinants of Health   Financial Resource Strain: Not on file  Food Insecurity: Not on file  Transportation Needs: Not on file  Physical Activity: Not on file  Stress: Not on file  Social Connections: Not on file  Intimate Partner Violence: Not on file      Family History  Problem Relation Age of Onset  . Heart attack Mother   . Heart disease Mother   .  Cancer Father   . Esophageal cancer Father   . Colon cancer Neg Hx   . Rectal cancer Neg Hx   . Stomach cancer Neg Hx      Review of Systems Shortness of breath with activity Cough productive of minimal white phlegm Joint stiffness in hands and knees   Constitutional: negative for anorexia, fevers and sweats  Eyes: negative for irritation, redness and visual disturbance  Ears, nose, mouth, throat, and face: negative for earaches, epistaxis, nasal congestion and sore throat  Respiratory: negative  for  sputum and wheezing  Cardiovascular: negative for chest pain, but has another, lower extremity edema, orthopnea, palpitations and syncope  Gastrointestinal: negative for abdominal pain, constipation, diarrhea, melena, nausea and vomiting  Genitourinary:negative for dysuria, frequency and hematuria  Hematologic/lymphatic: negative for bleeding, easy bruising and lymphadenopathy  Musculoskeletal:negative for muscle weakness   Neurological: negative for coordination problems, gait problems, headaches and weakness  Endocrine: negative for diabetic symptoms including polydipsia, polyuria and weight loss     Objective:   Physical Exam  Gen. Pleasant, well-nourished, in no distress, normal affect ENT - no pallor,icterus, no post nasal drip Neck: No JVD, no thyromegaly, no carotid bruits Lungs: no use of accessory muscles, no dullness to percussion, bibasal dry rales no rhonchi  Cardiovascular: Rhythm regular, heart sounds  normal, no murmurs or gallops, no peripheral edema Abdomen: soft and non-tender, no hepatosplenomegaly, BS normal. Musculoskeletal: No deformities, no cyanosis or clubbing, no significant joint swelling Neuro:  alert, non focal       Assessment & Plan:

## 2020-11-28 NOTE — Assessment & Plan Note (Signed)
Chronic cough could certainly be related to ILD but lisinopril is a confounder. We will DC lisinopril and replace this with olmesartan instead.  She will monitor her blood pressure on this and have further follow-up with her PCP

## 2021-03-03 ENCOUNTER — Other Ambulatory Visit: Payer: Self-pay | Admitting: Pulmonary Disease

## 2021-05-31 DIAGNOSIS — Z1231 Encounter for screening mammogram for malignant neoplasm of breast: Secondary | ICD-10-CM | POA: Diagnosis not present

## 2021-06-29 ENCOUNTER — Other Ambulatory Visit: Payer: Self-pay

## 2021-06-29 ENCOUNTER — Ambulatory Visit (INDEPENDENT_AMBULATORY_CARE_PROVIDER_SITE_OTHER): Payer: Medicare HMO

## 2021-06-29 ENCOUNTER — Ambulatory Visit (HOSPITAL_COMMUNITY)
Admission: EM | Admit: 2021-06-29 | Discharge: 2021-06-29 | Disposition: A | Discharge status: Home / Self Care | Payer: Medicare HMO | Attending: Physician Assistant | Admitting: Physician Assistant

## 2021-06-29 ENCOUNTER — Encounter (HOSPITAL_COMMUNITY): Payer: Self-pay

## 2021-06-29 DIAGNOSIS — J189 Pneumonia, unspecified organism: Secondary | ICD-10-CM | POA: Diagnosis not present

## 2021-06-29 DIAGNOSIS — R051 Acute cough: Secondary | ICD-10-CM

## 2021-06-29 DIAGNOSIS — R059 Cough, unspecified: Secondary | ICD-10-CM

## 2021-06-29 DIAGNOSIS — R0602 Shortness of breath: Secondary | ICD-10-CM | POA: Diagnosis not present

## 2021-06-29 MED ORDER — AMOXICILLIN-POT CLAVULANATE 875-125 MG PO TABS: 1.0000 | ORAL_TABLET | Freq: Two times a day (BID) | ORAL | 0 refills | Status: AC

## 2021-06-29 MED ORDER — BENZONATATE 100 MG PO CAPS: 100.0000 mg | ORAL_CAPSULE | Freq: Four times a day (QID) | ORAL | 0 refills | Status: DC | PRN

## 2021-06-29 NOTE — Discharge Instructions (Addendum)
See your Physician for recheck.  °

## 2021-06-29 NOTE — ED Triage Notes (Signed)
Per interpreter, pt c/o productive cough with lots of white sputum since 04/2022 when she had RSV.

## 2021-06-29 NOTE — ED Provider Notes (Signed)
[facility]    CSN: [id number] Arrival date & time: 06/29/21  1118      History   Chief Complaint Chief Complaint  Patient presents with   Cough    HPI Tina Gray is a 56 y.o. female.   Pt complains of cough.  Pt reports she had covid a year ago and rsv in November.  Pt reports cough is getting worse   The history is provided by the patient. No language interpreter was used.  Cough Cough characteristics:  Non-productive Sputum characteristics:  Nondescript Severity:  Moderate Onset quality:  Gradual Timing:  Constant Progression:  Partially resolved Chronicity:  New Smoker: no   Relieved by:  Nothing Worsened by:  Nothing Ineffective treatments:  None tried Associated symptoms: sore throat    Past Medical History:  Diagnosis Date   Carpal tunnel syndrome    Chest pain    Deaf    GERD (gastroesophageal reflux disease)    Hypertension     Patient Active Problem List   Diagnosis Date Noted   ILD (interstitial lung disease) ([place]) 11/28/2020   Rheumatoid arthritis flare ([place]) 11/28/2020   Dysphagia    Chest pain at rest 12/02/2015   HTN (hypertension) 12/02/2015   Hypokalemia 12/02/2015   Elevated troponin 12/02/2015   Chest pain 12/02/2015   Right arm pain 12/02/2015   Abnormal uterine bleeding 03/17/2013   Anemia, iron deficiency 03/05/2013    Past Surgical History:  Procedure Laterality Date   ESOPHAGEAL MANOMETRY N/A 03/13/2016   Procedure: ESOPHAGEAL MANOMETRY (EM);  Surgeon: Doran Stabler, MD;  Location: [facility];  Service: Gastroenterology;  Laterality: N/A;   LAPAROSCOPIC OVARIAN CYSTECTOMY      OB History     Gravida  2   Para  2   Term      Preterm      AB      Living  2      SAB      IAB      Ectopic      Multiple      Live Births               Home Medications    Prior to Admission medications   Medication Sig Start Date End Date Taking? Authorizing Provider  amoxicillin-clavulanate  (AUGMENTIN) 875-125 MG tablet Take 1 tablet by mouth 2 (two) times daily for 10 days. 06/29/21 07/09/21 Yes Caryl Ada K, PA-C  benzonatate (TESSALON PERLES) 100 MG capsule Take 1 capsule (100 mg total) by mouth every 6 (six) hours as needed for cough. 06/29/21 06/29/22 Yes Fransico Meadow, PA-C  amLODipine (NORVASC) 10 MG tablet  08/03/20   [provider]  azaTHIOprine (IMURAN) 50 MG tablet  06/06/20   [provider]  etanercept (ENBREL) 50 MG/ML injection Inject 50 mg as directed every Wednesday. Patient not taking: Reported on 11/28/2020 04/01/17   [provider]  ibuprofen (ADVIL,MOTRIN) 200 MG tablet Take 400 mg by mouth daily as needed for headache. Patient not taking: Reported on 11/28/2020    [provider]  lisinopril (ZESTRIL) 20 MG tablet  08/09/20   [provider]  meloxicam (MOBIC) 7.5 MG tablet Take 1 tablet (7.5 mg total) by mouth daily. 11/06/19   Faustino Congress, NP  Multiple Vitamin (MULTIVITAMIN WITH MINERALS) TABS tablet Take 1 tablet by mouth daily. Patient not taking: Reported on 11/28/2020    [provider]  olmesartan (BENICAR) 20 MG tablet Take 1 tablet (20  mg total) by mouth daily. 11/28/20   Rigoberto Noel, MD  prednisoLONE 5 MG TABS tablet Take by mouth.    [provider]  sucralfate (CARAFATE) 1 g tablet Take 1 g by mouth 2 (two) times daily. Patient not taking: Reported on 11/28/2020    [provider]    Family History Family History  Problem Relation Age of Onset   Heart attack Mother    Heart disease Mother    Cancer Father    Esophageal cancer Father    Colon cancer Neg Hx    Rectal cancer Neg Hx    Stomach cancer Neg Hx     Social History Social History   Tobacco Use   Smoking status: Never   Smokeless tobacco: Never  Substance Use Topics   Alcohol use: Yes    Comment: rare wine 2-3 month   Drug use: No     Allergies   Patient has no known allergies.   Review of  Systems Review of Systems  HENT:  Positive for sore throat.   Respiratory:  Positive for cough.   All other systems reviewed and are negative.   Physical Exam Triage Vital Signs ED Triage Vitals  Enc Vitals Group     BP 06/29/21 1259 (!) 147/92     Pulse Rate 06/29/21 1259 90     Resp 06/29/21 1259 18     Temp 06/29/21 1259 98.2 F (36.8 C)     Temp Source 06/29/21 1259 Oral     SpO2 06/29/21 1259 99 %     Weight --      Height --      Head Circumference --      Peak Flow --      Pain Score 06/29/21 1300 2     Pain Loc --      Pain Edu? --      Excl. in [place]? --    No data found.  Updated Vital Signs BP (!) 147/92 (BP Location: Left Arm)    Pulse 90    Temp 98.2 F (36.8 C) (Oral)    Resp 18    LMP 06/10/2014    SpO2 99%   Visual Acuity Right Eye Distance:   Left Eye Distance:   Bilateral Distance:    Right Eye Near:   Left Eye Near:    Bilateral Near:     Physical Exam Vitals and nursing note reviewed.  Constitutional:      Appearance: She is well-developed.  HENT:     Head: Normocephalic.     Mouth/Throat:     Mouth: Mucous membranes are moist.  Cardiovascular:     Rate and Rhythm: Normal rate and regular rhythm.  Pulmonary:     Effort: Pulmonary effort is normal.     Breath sounds: Normal breath sounds.  Abdominal:     General: Abdomen is flat. There is no distension.  Musculoskeletal:        General: Normal range of motion.     Cervical back: Normal range of motion.  Skin:    General: Skin is warm.  Neurological:     Mental Status: She is alert and oriented to person, place, and time.     UC Treatments / Results  Labs (all labs ordered are listed, but only abnormal results are displayed) Labs Reviewed - No data to display  EKG   Radiology DG Chest 2 View  Result Date: 06/29/2021 CLINICAL DATA:  Cough.  Prior RSV.  Shortness of breath. EXAM: CHEST - 2 VIEW COMPARISON:  12/02/2015 FINDINGS: Midline trachea. Normal heart size and mediastinal  contours. Mild right hemidiaphragm elevation. Suspect small left pleural effusion. No pneumothorax. Right greater than left base dependent airspace disease. S shaped thoracolumbar spine curvature. IMPRESSION: Right greater than left base dependent airspace disease, new or progressive since 12/02/2015. Given clinical history, favored to represent pneumonia. Progression of interstitial lung disease could look similar. Consider antibiotic therapy and plain film follow-up. Probable small left pleural effusion. Electronically Signed   By: Abigail Miyamoto M.D.   On: 06/29/2021 13:36    Procedures Procedures (including critical care time)  Medications Ordered in UC Medications - No data to display  Initial Impression / Assessment and Plan / UC Course  I have reviewed the triage vital signs and the nursing notes.  Pertinent labs & imaging results that were available during my care of the patient were reviewed by me and considered in my medical decision making (see chart for details).     MDM:  Chest xray shows pneumonia.  Pt given rx for augmentin.  Pt advised to see her MD for recheck  Final Clinical Impressions(s) / UC Diagnoses   Final diagnoses:  Pneumonia due to infectious organism, unspecified laterality, unspecified part of lung  Acute cough     Discharge Instructions      See your Physician for recheck    ED Prescriptions     Medication Sig Dispense Auth. Provider   amoxicillin-clavulanate (AUGMENTIN) 875-125 MG tablet Take 1 tablet by mouth 2 (two) times daily for 10 days. 20 tablet Deborrah Mabin K, PA-C   benzonatate (TESSALON PERLES) 100 MG capsule Take 1 capsule (100 mg total) by mouth every 6 (six) hours as needed for cough. 30 capsule Fransico Meadow, [place]      PDMP not reviewed this encounter.   Fransico Meadow, [place] 06/29/21 1430

## 2021-08-02 ENCOUNTER — Other Ambulatory Visit: Payer: Self-pay | Admitting: Nurse Practitioner

## 2021-08-02 ENCOUNTER — Ambulatory Visit
Admission: RE | Admit: 2021-08-02 | Discharge: 2021-08-02 | Disposition: A | Discharge status: Home / Self Care | Payer: Medicare HMO | Source: Ambulatory Visit | Attending: Nurse Practitioner | Admitting: Nurse Practitioner

## 2021-08-02 DIAGNOSIS — J189 Pneumonia, unspecified organism: Secondary | ICD-10-CM

## 2021-08-07 ENCOUNTER — Other Ambulatory Visit: Payer: Self-pay | Admitting: Pulmonary Disease

## 2021-08-08 LAB — SARS CORONAVIRUS 2 (TAT 6-24 HRS): SARS Coronavirus 2: NEGATIVE

## 2021-08-10 ENCOUNTER — Ambulatory Visit: Payer: Medicare Other | Admitting: Pulmonary Disease

## 2021-08-10 ENCOUNTER — Encounter: Payer: Self-pay | Admitting: Adult Health

## 2021-08-10 ENCOUNTER — Other Ambulatory Visit: Payer: Self-pay

## 2021-08-10 ENCOUNTER — Ambulatory Visit (INDEPENDENT_AMBULATORY_CARE_PROVIDER_SITE_OTHER): Payer: Medicare HMO | Admitting: Adult Health

## 2021-08-10 VITALS — BP 102/60 | HR 78 | Temp 98.3°F | Ht 66.0 in | Wt 147.6 lb

## 2021-08-10 DIAGNOSIS — M069 Rheumatoid arthritis, unspecified: Secondary | ICD-10-CM

## 2021-08-10 DIAGNOSIS — J189 Pneumonia, unspecified organism: Secondary | ICD-10-CM | POA: Diagnosis not present

## 2021-08-10 DIAGNOSIS — R053 Chronic cough: Secondary | ICD-10-CM | POA: Diagnosis not present

## 2021-08-10 DIAGNOSIS — J849 Interstitial pulmonary disease, unspecified: Secondary | ICD-10-CM

## 2021-08-10 DIAGNOSIS — I10 Essential (primary) hypertension: Secondary | ICD-10-CM

## 2021-08-10 NOTE — Assessment & Plan Note (Signed)
Continue follow-up with primary care regarding ongoing blood pressure management.  Patient is to stop lisinopril. Continue on her current blood pressure medications.

## 2021-08-10 NOTE — Assessment & Plan Note (Signed)
Patient with ongoing daily chronic cough most likely secondary interstitial lung disease.  Need to stop lisinopril as may be aggravating her chronic daily cough. Patient is aware to stop this medication  Plan  Patient Instructions  Stop Lisinopril.  Delsym 2 tsp Twice daily  As needed  cough (this is over the counter)  Tessalon Three times a day  As needed  cough . Set up HRCT Chest  Refer to Rheumatology .  Follow up in 4-6 weeks with Dr. Elsworth Soho  with PFTs and As needed   Please contact office for sooner follow up if symptoms do not improve or worsen or seek emergency care

## 2021-08-10 NOTE — Assessment & Plan Note (Signed)
Unable to do PFTs today.  Needs high-resolution CT chest.  Patient has underlying rheumatoid arthritis may have RA related ILD.   Recommend that she return in 4 weeks for PFTs.  We will set up a high-res CT. Needs referral to rheumatology for new rheumatologist.  Plan  Patient Instructions  Stop Lisinopril.  Delsym 2 tsp Twice daily  As needed  cough (this is over the counter)  Tessalon Three times a day  As needed  cough . Set up HRCT Chest  Refer to Rheumatology .  Follow up in 4-6 weeks with Dr. Elsworth Soho  with PFTs and As needed   Please contact office for sooner follow up if symptoms do not improve or worsen or seek emergency care

## 2021-08-10 NOTE — Assessment & Plan Note (Signed)
Treated for community-acquired pneumonia last month.  She has completed antibiotics. No further antibiotics at this time.   High-resolution CT chest is pending.  Plan  Patient Instructions  Stop Lisinopril.  Delsym 2 tsp Twice daily  As needed  cough (this is over the counter)  Tessalon Three times a day  As needed  cough . Set up HRCT Chest  Refer to Rheumatology .  Follow up in 4-6 weeks with Dr. Elsworth Soho  with PFTs and As needed   Please contact office for sooner follow up if symptoms do not improve or worsen or seek emergency care

## 2021-08-10 NOTE — Patient Instructions (Addendum)
Stop Lisinopril.  Delsym 2 tsp Twice daily  As needed  cough (this is over the counter)  Tessalon Three times a day  As needed  cough . Set up HRCT Chest  Refer to Rheumatology .  Follow up in 4-6 weeks with Dr. Elsworth Soho  with PFTs and As needed   Please contact office for sooner follow up if symptoms do not improve or worsen or seek emergency care

## 2021-08-10 NOTE — Progress Notes (Signed)
@Patient  ID: Tina Gray, female    DOB: October 01, 1965, 56 y.o.   MRN: [id number]  Chief Complaint  Patient presents with   Follow-up    Referring provider: Elwyn Reach, MD  HPI: 56 year old female never smoker seen for pulmonary consult November 28, 2020 for interstitial lung disease Medical history significant for deafness, rheumatoid arthritis  TEST/EVENTS :   08/10/2021 Follow up : ILD  Patient returns for a follow-up visit.  Patient was last seen November 28, 2020 when she was seen for pulmonary consult for interstitial lung disease.  Patient is accompanied by her interpreter today.  As she is deaf.  Patient has a long history of rheumatoid arthritis.  Was previously on Imuran and prednisone 5 mg.  She is no longer taking Imuran.  Says that she has not been able to follow-up with rheumatology as she was discharged from Dr. Lenna Gilford practice for missed appointments.  She is currently on prednisone 5 mg daily.   Last visit patient was recommended to stop lisinopril due to ongoing cough.  Unfortunately directions were misunderstood.  And patient stayed on lisinopril and also started Benicar.  Today we had a long discussion regarding stopping lisinopril.  With the interpreter she does indicate she understands and was given written instructions. Patient was set up for pulmonary function testing but unfortunately was unable to complete today.  Patient says that she has been sick with increased cough and congestion has recently been treated for pneumonia.  Chest x-ray done on August 02, 2021 showed unchanged bibasilar opacities.  Patient complains that she has a daily minimally productive cough.  The cough is very aggravating to her. She gets short of breath with heavy activities.    No Known Allergies  Immunization History  Administered Date(s) Administered   Influenza,inj,Quad PF,6+ Mos 03/15/2013    Past Medical History:  Diagnosis Date   Carpal tunnel syndrome    Chest pain    Deaf     GERD (gastroesophageal reflux disease)    Hypertension     Tobacco History: Social History   Tobacco Use  Smoking Status Never  Smokeless Tobacco Never   Counseling given: Not Answered   Outpatient Medications Prior to Visit  Medication Sig Dispense Refill   amLODipine (NORVASC) 10 MG tablet      azaTHIOprine (IMURAN) 50 MG tablet      benzonatate (TESSALON PERLES) 100 MG capsule Take 1 capsule (100 mg total) by mouth every 6 (six) hours as needed for cough. 30 capsule 0   lisinopril (ZESTRIL) 20 MG tablet      meloxicam (MOBIC) 7.5 MG tablet Take 1 tablet (7.5 mg total) by mouth daily. 30 tablet 0   olmesartan (BENICAR) 20 MG tablet Take 1 tablet (20 mg total) by mouth daily. 30 tablet 3   prednisoLONE 5 MG TABS tablet Take by mouth.     etanercept (ENBREL) 50 MG/ML injection Inject 50 mg as directed every Wednesday. (Patient not taking: Reported on 11/28/2020)     ibuprofen (ADVIL,MOTRIN) 200 MG tablet Take 400 mg by mouth daily as needed for headache. (Patient not taking: Reported on 11/28/2020)     Multiple Vitamin (MULTIVITAMIN WITH MINERALS) TABS tablet Take 1 tablet by mouth daily. (Patient not taking: Reported on 08/10/2021)     sucralfate (CARAFATE) 1 g tablet Take 1 g by mouth 2 (two) times daily. (Patient not taking: Reported on 11/28/2020)     Facility-Administered Medications Prior to Visit  Medication Dose Route Frequency Provider Last  Rate Last Admin   loratadine (CLARITIN) tablet 10 mg  10 mg Oral Daily Gavin Pound, MD         Review of Systems:   Constitutional:   No  weight loss, night sweats,  Fevers, chills, fatigue, or  lassitude.  HEENT:   No headaches,  Difficulty swallowing,  Tooth/dental problems, or  Sore throat,                No sneezing, itching, ear ache, nasal congestion, post nasal drip,   CV:  No chest pain,  Orthopnea, PND, swelling in lower extremities, anasarca, dizziness, palpitations, syncope.   GI  No heartburn, indigestion, abdominal  pain, nausea, vomiting, diarrhea, change in bowel habits, loss of appetite, bloody stools.   Resp: No shortness of breath with exertion or at rest.  No excess mucus, no productive cough,  No non-productive cough,  No coughing up of blood.  No change in color of mucus.  No wheezing.  No chest wall deformity  Skin: no rash or lesions.  GU: no dysuria, change in color of urine, no urgency or frequency.  No flank pain, no hematuria   MS:  No joint pain or swelling.  No decreased range of motion.  No back pain.    Physical Exam  BP 102/60 (BP Location: Left Arm, Patient Position: Sitting, Cuff Size: Normal)    Pulse 78    Temp 98.3 F (36.8 C) (Oral)    Ht 5\' 6"  (1.676 m)    Wt 147 lb 9.6 oz (67 kg)    LMP 06/10/2014    SpO2 97%    BMI 23.82 kg/m   GEN: A/Ox3; pleasant , NAD, well nourished    HEENT:  [place]/AT,  EACs-clear, TMs-wnl, NOSE-clear, THROAT-clear, no lesions, no postnasal drip or exudate noted.   NECK:  Supple w/ fair ROM; no JVD; normal carotid impulses w/o bruits; no thyromegaly or nodules palpated; no lymphadenopathy.    RESP  Clear  P & A; w/o, wheezes/ rales/ or rhonchi. no accessory muscle use, no dullness to percussion  CARD:  RRR, no m/r/g, no peripheral edema, pulses intact, no cyanosis or clubbing.  GI:   Soft & nt; nml bowel sounds; no organomegaly or masses detected.   Musco: Warm bil, no deformities or joint swelling noted.   Neuro: alert, no focal deficits noted.    Skin: Warm, no lesions or rashes    Lab Results:  CBC    Component Value Date/Time   WBC 8.5 11/06/2019 1354   RBC 4.53 11/06/2019 1354   HGB 14.7 11/06/2019 1354   HCT 45.2 11/06/2019 1354   PLT 221 11/06/2019 1354   MCV 99.8 11/06/2019 1354   MCH 32.5 11/06/2019 1354   MCHC 32.5 11/06/2019 1354   RDW 14.0 11/06/2019 1354   LYMPHSABS 1.2 11/06/2019 1354   MONOABS 1.1 (H) 11/06/2019 1354   EOSABS 0.3 11/06/2019 1354   BASOSABS 0.1 11/06/2019 1354    BMET    Component Value  Date/Time   NA 138 04/14/2017 1205   K 3.7 04/14/2017 1205   CL 106 04/14/2017 1205   CO2 26 04/14/2017 1205   GLUCOSE 81 04/14/2017 1205   BUN 9 04/14/2017 1205   CREATININE 0.71 04/14/2017 1205   CALCIUM 9.1 04/14/2017 1205   GFRNONAA >60 04/14/2017 1205   GFRAA >60 04/14/2017 1205    BNP No results found for: BNP  ProBNP No results found for: PROBNP  Imaging: DG Chest 2 View  Result  Date: 08/04/2021 CLINICAL DATA:  Unresolved pneumonia EXAM: CHEST - 2 VIEW COMPARISON:  June 29, 2021 FINDINGS: Bibasilar opacities are unchanged. The heart, hila, mediastinum, lungs, and pleura are otherwise stable. No other acute abnormalities. IMPRESSION: The bibasilar opacities are unchanged and have not resolved. Recommend a CT scan of the chest for further evaluation. These results will be called to the ordering clinician or representative by the Radiologist Assistant, and communication documented in the PACS or [employer]. Electronically Signed   By: Dorise Bullion III M.D.   On: 08/04/2021 16:33      No flowsheet data found.  No results found for: NITRICOXIDE      Assessment & Plan:   ILD (interstitial lung disease) ([place]) Unable to do PFTs today.  Needs high-resolution CT chest.  Patient has underlying rheumatoid arthritis may have RA related ILD.   Recommend that she return in 4 weeks for PFTs.  We will set up a high-res CT. Needs referral to rheumatology for new rheumatologist.  Plan  Patient Instructions  Stop Lisinopril.  Delsym 2 tsp Twice daily  As needed  cough (this is over the counter)  Tessalon Three times a day  As needed  cough . Set up HRCT Chest  Refer to Rheumatology .  Follow up in 4-6 weeks with Dr. Elsworth Soho  with PFTs and As needed   Please contact office for sooner follow up if symptoms do not improve or worsen or seek emergency care       Chronic cough Patient with ongoing daily chronic cough most likely secondary interstitial lung disease.  Need  to stop lisinopril as may be aggravating her chronic daily cough. Patient is aware to stop this medication  Plan  Patient Instructions  Stop Lisinopril.  Delsym 2 tsp Twice daily  As needed  cough (this is over the counter)  Tessalon Three times a day  As needed  cough . Set up HRCT Chest  Refer to Rheumatology .  Follow up in 4-6 weeks with Dr. Elsworth Soho  with PFTs and As needed   Please contact office for sooner follow up if symptoms do not improve or worsen or seek emergency care       HTN (hypertension) Continue follow-up with primary care regarding ongoing blood pressure management.  Patient is to stop lisinopril. Continue on her current blood pressure medications.  CAP (community acquired pneumonia) Treated for community-acquired pneumonia last month.  She has completed antibiotics. No further antibiotics at this time.   High-resolution CT chest is pending.  Plan  Patient Instructions  Stop Lisinopril.  Delsym 2 tsp Twice daily  As needed  cough (this is over the counter)  Tessalon Three times a day  As needed  cough . Set up HRCT Chest  Refer to Rheumatology .  Follow up in 4-6 weeks with Dr. Elsworth Soho  with PFTs and As needed   Please contact office for sooner follow up if symptoms do not improve or worsen or seek emergency care       Rheumatoid arthritis [facility]) History of rheumatoid arthritis previously on Imuran.  Remains on prednisone 5 mg.  Patient needs new rheumatologist.  Referral was placed    Jerzi Tigert, NP 08/10/2021

## 2021-08-10 NOTE — Assessment & Plan Note (Signed)
History of rheumatoid arthritis previously on Imuran.  Remains on prednisone 5 mg.  Patient needs new rheumatologist.  Referral was placed

## 2021-08-20 ENCOUNTER — Ambulatory Visit (HOSPITAL_BASED_OUTPATIENT_CLINIC_OR_DEPARTMENT_OTHER)
Admission: RE | Admit: 2021-08-20 | Discharge: 2021-08-20 | Disposition: A | Discharge status: Home / Self Care | Payer: Medicare HMO | Source: Ambulatory Visit | Attending: Adult Health | Admitting: Adult Health

## 2021-08-20 ENCOUNTER — Encounter (HOSPITAL_BASED_OUTPATIENT_CLINIC_OR_DEPARTMENT_OTHER): Payer: Self-pay

## 2021-08-20 ENCOUNTER — Other Ambulatory Visit: Payer: Self-pay

## 2021-08-20 DIAGNOSIS — J849 Interstitial pulmonary disease, unspecified: Secondary | ICD-10-CM | POA: Diagnosis not present

## 2021-08-20 DIAGNOSIS — J189 Pneumonia, unspecified organism: Secondary | ICD-10-CM | POA: Insufficient documentation

## 2021-08-20 DIAGNOSIS — R053 Chronic cough: Secondary | ICD-10-CM | POA: Insufficient documentation

## 2021-08-28 ENCOUNTER — Other Ambulatory Visit: Payer: Self-pay | Admitting: *Deleted

## 2021-08-28 DIAGNOSIS — R911 Solitary pulmonary nodule: Secondary | ICD-10-CM

## 2021-08-28 DIAGNOSIS — M069 Rheumatoid arthritis, unspecified: Secondary | ICD-10-CM

## 2021-08-28 NOTE — Progress Notes (Signed)
Called and spoke with Silas [place] (niece), patient is deaf and she interprets for her.  Advised of results/recommendations per Rexene Edison NP.  She verbalized understanding.  Referral to rheumatology placed as well as order for CT scan in 3 months.

## 2021-09-07 ENCOUNTER — Ambulatory Visit (INDEPENDENT_AMBULATORY_CARE_PROVIDER_SITE_OTHER): Payer: Medicare HMO | Admitting: Primary Care

## 2021-09-07 ENCOUNTER — Other Ambulatory Visit: Payer: Self-pay

## 2021-09-07 ENCOUNTER — Telehealth: Payer: Self-pay | Admitting: Primary Care

## 2021-09-07 ENCOUNTER — Encounter: Payer: Self-pay | Admitting: Primary Care

## 2021-09-07 DIAGNOSIS — J849 Interstitial pulmonary disease, unspecified: Secondary | ICD-10-CM

## 2021-09-07 DIAGNOSIS — M069 Rheumatoid arthritis, unspecified: Secondary | ICD-10-CM

## 2021-09-07 DIAGNOSIS — M25572 Pain in left ankle and joints of left foot: Secondary | ICD-10-CM

## 2021-09-07 DIAGNOSIS — I10 Essential (primary) hypertension: Secondary | ICD-10-CM

## 2021-09-07 MED ORDER — ALBUTEROL SULFATE HFA 108 (90 BASE) MCG/ACT IN AERS: 2.0000 | INHALATION_SPRAY | Freq: Four times a day (QID) | RESPIRATORY_TRACT | 6 refills | Status: AC | PRN

## 2021-09-07 MED ORDER — PREDNISONE 10 MG PO TABS: ORAL_TABLET | ORAL | 0 refills | Status: AC

## 2021-09-07 MED ORDER — FAMOTIDINE 20 MG PO TABS: 20.0000 mg | ORAL_TABLET | Freq: Every day | ORAL | 0 refills | Status: DC

## 2021-09-07 NOTE — Assessment & Plan Note (Addendum)
-   Patient has had a chronic cough x 2 months with associated dyspnea symptoms and chest tightness. HRCT in Feb 2023 showed progression of ILD since 2017. She has underlying  rheumatoid arthritis and was previously followed by Dr. Trudie Reed but was discharged from their practice. She is currently no longer taking Imuran, remains on prednisone '5mg'$  daily which appears to be controlling her arthritis symptoms. She has been referred to new rheumatologist but has not been contacted to set up an appointment, we will look into this for her. Treating for suspected flare with prednisone taper. We will also treat for underlying GERD symptoms that could be exacerbating her cough. She is scheduled for repeat PFTs and follow-up visit with Dr. Elsworth Soho in April. ?

## 2021-09-07 NOTE — Patient Instructions (Addendum)
Nice meeting you today Tina Gray ? ?Recommendations: ?- Increased prednisone to '20mg'$  daily for 1 week; then take '10mg'$  daily for 1 week; then resume '5mg'$  daily (I will send in prescription for this taper) ?- Starting a medication for reflux called Famotidine (Pepcid) '40mg'$  at bedtime  ?- You can use Albuterol inhaler two puffs every 6 hours as needed for shortness of breath/chest tightness/wheezing ?- Take Hycodan cough syrup every 6 hours as needed for cough (call if you need refill) ? ?Follow-up: ?- You have an appointment for a breathing test on April 5th at 2pm followed by visit with Dr. Elsworth Soho at 3pm  ? ? ? ?Food Choices for Gastroesophageal Reflux Disease, Adult ?When you have gastroesophageal reflux disease (GERD), the foods you eat and your eating habits are very important. Choosing the right foods can help ease your discomfort. Think about working with a food expert (dietitian) to help you make good choices. ?What are tips for following this plan? ?Reading food labels ?Look for foods that are low in saturated fat. Foods that may help with your symptoms include: ?Foods that have less than 5% of daily value (DV) of fat. ?Foods that have 0 grams of trans fat. ?Cooking ?Do not fry your food. ?Cook your food by baking, steaming, grilling, or broiling. These are all methods that do not need a lot of fat for cooking. ?To add flavor, try to use herbs that are low in spice and acidity. ?Meal planning ? ?Choose healthy foods that are low in fat, such as: ?Fruits and vegetables. ?Whole grains. ?Low-fat dairy products. ?Lean meats, fish, and poultry. ?Eat small meals often instead of eating 3 large meals each day. Eat your meals slowly in a place where you are relaxed. Avoid bending over or lying down until 2-3 hours after eating. ?Limit high-fat foods such as fatty meats or fried foods. ?Limit your intake of fatty foods, such as oils, butter, and shortening. ?Avoid the following as told by your doctor: ?Foods that cause  symptoms. These may be different for different people. Keep a food diary to keep track of foods that cause symptoms. ?Alcohol. ?Drinking a lot of liquid with meals. ?Eating meals during the 2-3 hours before bed. ?Lifestyle ?Stay at a healthy weight. Ask your doctor what weight is healthy for you. If you need to lose weight, work with your doctor to do so safely. ?Exercise for at least 30 minutes on 5 or more days each week, or as told by your doctor. ?Wear loose-fitting clothes. ?Do not smoke or use any products that contain nicotine or tobacco. If you need help quitting, ask your doctor. ?Sleep with the head of your bed higher than your feet. Use a wedge under the mattress or blocks under the bed frame to raise the head of the bed. ?Chew sugar-free gum after meals. ?What foods should eat? ?Eat a healthy, well-balanced diet of fruits, vegetables, whole grains, low-fat dairy products, lean meats, fish, and poultry. Each person is different. ?Foods that may cause symptoms in one person may not cause any symptoms in another person. Work with your doctor to find foods that are safe for you. ?The items listed above may not be a complete list of what you can eat and drink. Contact a food expert for more options. ?What foods should I avoid? ?Limiting some of these foods may help in managing the symptoms of GERD. Everyone is different. Talk with a food expert or your doctor to help you find the exact foods  to avoid, if any. ?Fruits ?Any fruits prepared with added fat. Any fruits that cause symptoms. For some people, this may include citrus fruits, such as oranges, grapefruit, pineapple, and lemons. ?Vegetables ?Deep-fried vegetables. [place] fries. Any vegetables prepared with added fat. Any vegetables that cause symptoms. For some people, this may include tomatoes and tomato products, chili peppers, onions and garlic, and horseradish. ?Grains ?Pastries or quick breads with added fat. ?Meats and other proteins ?High-fat  meats, such as fatty beef or pork, hot dogs, ribs, ham, sausage, salami, and bacon. Fried meat or protein, including fried fish and fried chicken. Nuts and nut butters, in large amounts. ?Dairy ?Whole milk and chocolate milk. Sour cream. Cream. Ice cream. Cream cheese. Milkshakes. ?Fats and oils ?Butter. Margarine. Shortening. Ghee. ?Beverages ?Coffee and tea, with or without caffeine. Carbonated beverages. Sodas. Energy drinks. Fruit juice made with acidic fruits, such as orange or grapefruit. Tomato juice. Alcoholic drinks. ?Sweets and desserts ?Chocolate and cocoa. Donuts. ?Seasonings and condiments ?Pepper. Peppermint and spearmint. Added salt. Any condiments, herbs, or seasonings that cause symptoms. For some people, this may include curry, hot sauce, or vinegar-based salad dressings. ?The items listed above may not be a complete list of what you should not eat and drink. Contact a food expert for more options. ?Questions to ask your doctor ?Diet and lifestyle changes are often the first steps that are taken to manage symptoms of GERD. If diet and lifestyle changes do not help, talk with your doctor about taking medicines. ?Where to find more information ?International Foundation for Gastrointestinal Disorders: aboutgerd.org ?Summary ?When you have GERD, food and lifestyle choices are very important in easing your symptoms. ?Eat small meals often instead of 3 large meals a day. Eat your meals slowly and in a place where you are relaxed. ?Avoid bending over or lying down until 2-3 hours after eating. ?Limit high-fat foods such as fatty meats or fried foods. ?This information is not intended to replace advice given to you by your health care provider. Make sure you discuss any questions you have with your health care provider. ?Document Revised: 12/20/2019 Document Reviewed: 12/20/2019 ?Elsevier Patient Education ? [place]. ? ?

## 2021-09-07 NOTE — Telephone Encounter (Signed)
Can we follow-up on referral to rheumatology? Previously with Dr. Trudie Reed. Needs new provider ?

## 2021-09-07 NOTE — Assessment & Plan Note (Addendum)
-   Arthritis symptoms appear controlled on '5mg'$  prednisone. No longer on Imuran  ?- Referred to rheumatology, awaiting appointment ?

## 2021-09-07 NOTE — Progress Notes (Signed)
? ?'@Patient'$  ID: Tina Gray, female    DOB: 02-01-1966, 56 y.o.   MRN: [id number] ? ?Chief Complaint  ?Patient presents with  ? Follow-up  ? ? ?Referring provider: ?Willene Hatchet, NP ? ?HPI: ?56 year old female never smoker seen for pulmonary consult November 28, 2020 for interstitial lung disease ?Medical history significant for deafness, rheumatoid arthritis. Patient of Dr. Elsworth Soho.  ?  ?Previous [facility] pulmonary encounter: ?08/10/2021 Follow up : ILD  ?Patient returns for a follow-up visit.  Patient was last seen November 28, 2020 when she was seen for pulmonary consult for interstitial lung disease.  Patient is accompanied by her interpreter today.  As she is deaf.  Patient has a long history of rheumatoid arthritis.  Was previously on Imuran and prednisone 5 mg.  She is no longer taking Imuran.  Says that she has not been able to follow-up with rheumatology as she was discharged from Dr. Lenna Gilford practice for missed appointments.  She is currently on prednisone 5 mg daily.   ?Last visit patient was recommended to stop lisinopril due to ongoing cough.  Unfortunately directions were misunderstood.  And patient stayed on lisinopril and also started Benicar.  Today we had a long discussion regarding stopping lisinopril.  With the interpreter she does indicate she understands and was given written instructions. ?Patient was set up for pulmonary function testing but unfortunately was unable to complete today.  Patient says that she has been sick with increased cough and congestion has recently been treated for pneumonia.  Chest x-ray done on August 02, 2021 showed unchanged bibasilar opacities.  Patient complains that she has a daily minimally productive cough.  The cough is very aggravating to her. ?She gets short of breath with heavy activities. ? ?09/07/2021 ?Patient presents today for acute visit. Patient has hx RA and ILD. Accompanied by sign language interpretor.  She was previously a patient on Dr. Lenna Gilford with rheumatology  but was discharged from their practice d/t missed appts. She is no longer on Imuran. She remains on prednisone '5mg'$  daily. She is no longer on lisinopril, currently taking Norvasc '10mg'$  daily. She has had a productive cough for several weeks. CXR on 2/9 showed unchanged bibasilar opacities. She was unable to complete pulmonary function testing in February 2023. She saw no improvement in her cough with Delsym, primary care provider prescribed Hycodan which has helped.  ? ?She has had a cough for 2 months. Cough is productive with clear mucus. She has associated shortness of breath symptoms with exertion and chest tightness with cough. She gets winded walking up stairs and with heavy exertion. She has no issues with dyspnea when sitting or walking flat surface. She has reflux at night when lying down. Ordered for HRCT during last visit which showed progression compared to 2017. Referred to new rheumatologist but has not heard from them regarding visit. She reports her arthritis symptoms have been well controlled on '5mg'$  prednisone. She is scheduled for repeat PFTs and follow-up with Dr. Elsworth Soho on April 5th.  ? ?No Known Allergies ? ?Immunization History  ?Administered Date(s) Administered  ? Influenza,inj,Quad PF,6+ Mos 03/15/2013  ? ? ?Past Medical History:  ?Diagnosis Date  ? Carpal tunnel syndrome   ? Chest pain   ? Deaf   ? GERD (gastroesophageal reflux disease)   ? Hypertension   ? ? ?Tobacco History: ?Social History  ? ?Tobacco Use  ?Smoking Status Never  ?Smokeless Tobacco Never  ? ?Counseling given: Not Answered ? ? ?Outpatient Medications Prior to  Visit  ?Medication Sig Dispense Refill  ? amLODipine (NORVASC) 10 MG tablet     ? HYDROcodone bit-homatropine (HYCODAN) 5-1.5 MG/5ML syrup Take 5 mLs by mouth every 6 (six) hours as needed for cough.    ? meloxicam (MOBIC) 15 MG tablet Take 15 mg by mouth.    ? olmesartan (BENICAR) 20 MG tablet Take 1 tablet (20 mg total) by mouth daily. 30 tablet 3  ? prednisoLONE 5 MG  TABS tablet Take by mouth.    ? benzonatate (TESSALON PERLES) 100 MG capsule Take 1 capsule (100 mg total) by mouth every 6 (six) hours as needed for cough. 30 capsule 0  ? chlorpheniramine-HYDROcodone (TUSSIONEX) 10-8 MG/5ML SUER SMARTSIG:5 Milliliter(s) By Mouth Every 12 Hours    ? lisinopril (ZESTRIL) 20 MG tablet     ? azaTHIOprine (IMURAN) 50 MG tablet  (Patient not taking: Reported on 09/07/2021)    ? meloxicam (MOBIC) 7.5 MG tablet Take 1 tablet (7.5 mg total) by mouth daily. (Patient not taking: Reported on 09/07/2021) 30 tablet 0  ? loratadine (CLARITIN) tablet 10 mg     ? ?No facility-administered medications prior to visit.  ? ? ?Review of Systems ? ?Review of Systems  ?Constitutional: Negative.   ?HENT: Negative.    ?Respiratory:  Positive for cough. Negative for chest tightness.   ?     DOE  ? ? ?Physical Exam ? ?BP 110/66 (BP Location: Left Arm, Cuff Size: Normal)   Pulse 75   Ht '5\' 6"'$  (1.676 m)   Wt 150 lb (68 kg)   LMP 06/10/2014   SpO2 98%   BMI 24.21 kg/m?  ?Physical Exam ?Constitutional:   ?   Appearance: Normal appearance.  ?HENT:  ?   Head: Normocephalic and atraumatic.  ?   Mouth/Throat:  ?   Mouth: Mucous membranes are moist.  ?   Pharynx: Oropharynx is clear.  ?Cardiovascular:  ?   Rate and Rhythm: Normal rate.  ?Pulmonary:  ?   Effort: Pulmonary effort is normal.  ?   Breath sounds: Rales present.  ?   Comments: Rales right base ?Musculoskeletal:     ?   General: Normal range of motion.  ?Skin: ?   General: Skin is warm and dry.  ?Neurological:  ?   General: No focal deficit present.  ?   Mental Status: She is alert and oriented to person, place, and time. Mental status is at baseline.  ?Psychiatric:     ?   Mood and Affect: Mood normal.     ?   Behavior: Behavior normal.     ?   Thought Content: Thought content normal.     ?   Judgment: Judgment normal.  ?  ? ?Lab Results: ? ?CBC ?   ?Component Value Date/Time  ? WBC 8.5 11/06/2019 1354  ? RBC 4.53 11/06/2019 1354  ? HGB 14.7 11/06/2019  1354  ? HCT 45.2 11/06/2019 1354  ? PLT 221 11/06/2019 1354  ? MCV 99.8 11/06/2019 1354  ? MCH 32.5 11/06/2019 1354  ? MCHC 32.5 11/06/2019 1354  ? RDW 14.0 11/06/2019 1354  ? LYMPHSABS 1.2 11/06/2019 1354  ? MONOABS 1.1 (H) 11/06/2019 1354  ? EOSABS 0.3 11/06/2019 1354  ? BASOSABS 0.1 11/06/2019 1354  ? ? ?BMET ?   ?Component Value Date/Time  ? NA 138 04/14/2017 1205  ? K 3.7 04/14/2017 1205  ? CL 106 04/14/2017 1205  ? CO2 26 04/14/2017 1205  ? GLUCOSE 81 04/14/2017 1205  ?  BUN 9 04/14/2017 1205  ? CREATININE 0.71 04/14/2017 1205  ? CALCIUM 9.1 04/14/2017 1205  ? GFRNONAA >60 04/14/2017 1205  ? GFRAA >60 04/14/2017 1205  ? ? ?BNP ?No results found for: BNP ? ?ProBNP ?No results found for: PROBNP ? ?Imaging: ?CT Chest High Resolution ? ?Result Date: 08/22/2021 ?CLINICAL DATA:  56 year old female with recent diagnosis of pneumonia. Cough for 1 month, worsening. EXAM: CT CHEST WITHOUT CONTRAST TECHNIQUE: Multidetector CT imaging of the chest was performed following the standard protocol without intravenous contrast. High resolution imaging of the lungs, as well as inspiratory and expiratory imaging, was performed. RADIATION DOSE REDUCTION: This exam was performed according to the departmental dose-optimization program which includes automated exposure control, adjustment of the mA and/or kV according to patient size and/or use of iterative reconstruction technique. COMPARISON:  Chest CT 05/23/2016. FINDINGS: Cardiovascular: Heart size is mildly enlarged. There is no significant pericardial fluid, thickening or pericardial calcification. No atherosclerotic calcifications are noted in the thoracic aorta or coronary arteries. Mediastinum/Nodes: No pathologically enlarged mediastinal or hilar lymph nodes. Please note that accurate exclusion of hilar adenopathy is limited on noncontrast CT scans. Esophagus is unremarkable in appearance. No axillary lymphadenopathy. Lungs/Pleura: High-resolution images demonstrate widespread  areas of ground-glass attenuation, septal thickening, thickening of the peribronchovascular interstitium, mild cylindrical bronchiectasis and peripheral bronchiolectasis with some regional areas of architec

## 2021-09-07 NOTE — Assessment & Plan Note (Signed)
-   No longer taking lisinopril ?- Continue Norvasc '10mg'$  daily  ?

## 2021-09-10 NOTE — Telephone Encounter (Signed)
Called and spoke with patient's niece. Provided her with the number to Dr. Trudie Reed office and provided her with the fax number to [facility].  ?

## 2021-09-10 NOTE — Telephone Encounter (Signed)
We have received a message back from Rhematology they need her records from the old doctor before they can make her an appt ?

## 2021-09-19 NOTE — Telephone Encounter (Signed)
PCC's, can we follow up on this? Was rheum appt scheduled? ?

## 2021-09-19 NOTE — Telephone Encounter (Signed)
Can we follow-up on rheum referral, were they able to get records from Dr. Trudie Reed  ?

## 2021-09-25 NOTE — Telephone Encounter (Signed)
Tina Gray, please provide an update on this referral.  Thanks! ?

## 2021-09-26 ENCOUNTER — Ambulatory Visit: Payer: Medicare HMO | Admitting: Pulmonary Disease

## 2021-09-26 NOTE — Telephone Encounter (Signed)
As of 09/18/2021 they still have not received the records and have closed the referral until they receive the records is can we send this to her old rheumatology doc that would solve this issue  ?

## 2021-09-27 ENCOUNTER — Other Ambulatory Visit (HOSPITAL_COMMUNITY): Payer: Self-pay | Admitting: Nurse Practitioner

## 2021-09-27 DIAGNOSIS — J189 Pneumonia, unspecified organism: Secondary | ICD-10-CM

## 2021-09-29 ENCOUNTER — Other Ambulatory Visit: Payer: Self-pay | Admitting: Primary Care

## 2021-10-29 ENCOUNTER — Ambulatory Visit (INDEPENDENT_AMBULATORY_CARE_PROVIDER_SITE_OTHER): Payer: Medicare HMO | Admitting: Pulmonary Disease

## 2021-10-29 DIAGNOSIS — J189 Pneumonia, unspecified organism: Secondary | ICD-10-CM

## 2021-10-29 DIAGNOSIS — J849 Interstitial pulmonary disease, unspecified: Secondary | ICD-10-CM | POA: Diagnosis not present

## 2021-10-29 LAB — PULMONARY FUNCTION TEST
FEF 25-75 Post: 2.38 L/sec
FEF 25-75 Pre: 1.98 L/sec
FEF2575-%Change-Post: 20 %
FEF2575-%Pred-Post: 103 %
FEF2575-%Pred-Pre: 86 %
FEV1-%Change-Post: 6 %
FEV1-%Pred-Post: 80 %
FEV1-%Pred-Pre: 75 %
FEV1-Post: 1.84 L
FEV1-Pre: 1.73 L
FEV1FVC-%Change-Post: 2 %
FEV1FVC-%Pred-Pre: 105 %
FEV6-%Change-Post: 3 %
FEV6-%Pred-Post: 75 %
FEV6-%Pred-Pre: 72 %
FEV6-Post: 2.12 L
FEV6-Pre: 2.04 L
FEV6FVC-%Change-Post: 0 %
FEV6FVC-%Pred-Post: 103 %
FEV6FVC-%Pred-Pre: 103 %
FVC-%Change-Post: 3 %
FVC-%Pred-Post: 72 %
FVC-%Pred-Pre: 70 %
FVC-Post: 2.12 L
FVC-Pre: 2.04 L
Post FEV1/FVC ratio: 87 %
Post FEV6/FVC ratio: 100 %
Pre FEV1/FVC ratio: 85 %
Pre FEV6/FVC Ratio: 100 %
RV % pred: 82 %
RV: 1.66 L
TLC % pred: 73 %
TLC: 3.9 L

## 2021-10-29 NOTE — Progress Notes (Signed)
Spirometry pre/post and pleth performed today. 

## 2021-10-29 NOTE — Patient Instructions (Signed)
Spirometry pre/post and pleth performed today. 

## 2021-10-31 ENCOUNTER — Ambulatory Visit (INDEPENDENT_AMBULATORY_CARE_PROVIDER_SITE_OTHER): Payer: Medicare HMO | Admitting: Pulmonary Disease

## 2021-10-31 ENCOUNTER — Encounter: Payer: Self-pay | Admitting: Pulmonary Disease

## 2021-10-31 VITALS — BP 98/60 | HR 74 | Ht 66.0 in | Wt 150.2 lb

## 2021-10-31 DIAGNOSIS — J849 Interstitial pulmonary disease, unspecified: Secondary | ICD-10-CM

## 2021-10-31 DIAGNOSIS — M069 Rheumatoid arthritis, unspecified: Secondary | ICD-10-CM | POA: Diagnosis not present

## 2021-10-31 NOTE — Assessment & Plan Note (Signed)
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  Her main complaint is cough rather than shortness of breath she does not desaturate on ambulation which is reassuring.  PFTs show moderate restriction, DLCO could not be measured. ?I explained to her that her flank pain was likely arising from chronic cough.  I could understand the long-term medication.  We will prescribe benzonatate ?

## 2021-10-31 NOTE — Assessment & Plan Note (Signed)
I think it is important for her to reestablish with rheumatology and see if she can get on a steroid sparing DMA RD was also mention of diagnosis of polymyositis in the past and this would need to be investigated she denies muscle pains today.  CK has not been measured.  We will refer her again to rheumatology ?

## 2021-10-31 NOTE — Progress Notes (Signed)
? ?  Subjective:  ? ? Patient ID: Tina Gray, female    DOB: [birth year], 56 y.o.   MRN: [id number] ? ?HPI ? ?56 yo deaf, never smoker for FU of of ILD. ?PCP - Jonelle Sidle ?Rheum - Marella Chimes, Trudie Reed ?GI - Acie Fredrickson ? ? she was discharged from Dr. Trudie Reed' practice for missed appointments.   ?Lisinopril dc'd due to persistent cough ?CXR on 2/9 showed unchanged bibasilar opacities ? ?36-monthfollow-up visit. ?I reviewed prior visit with APP.  PFTs were ordered.  Hycodan was continued for cough. ?She arrives accompanied by interpreter today.  She complains of cough and pain along her right flank.  We reviewed CT chest and PFTs today. ?On ambulation she did not desaturate ?She reports pain in her wrist and ankles.  She would like a refill on Hycodan ? ?Significant tests/ events reviewed ? ? ?PFTs 10/2021 shows mild to moderate restriction, ratio 85, FEV1 75%, FVC 70%, no bronchodilator response, TLC 73%, DLCO maneuver could not be completed ?HRCT in Feb 2023 > progression , probable UIP pattern ? ?CT chest / abd/pelvis w con 09/2020 >> Bilateral lower lung groundglass, reticulation, and traction bronchiectasis are present , no honeycombing , 'probable UIP' ?  ? ?CT chest w con 04/2016 >> Hazy diffuse ground-glass opacities in both lower lobes ? viral pneumonia, idiopathic interstitial pneumonia, ?sequela of alveolar edema or possibly hypersensitivity pneumonitis ?  ?Esophageal manometry 2017 ? ? ? ?Review of Systems ?neg for any significant sore throat, dysphagia, itching, sneezing, nasal congestion or excess/ purulent secretions, fever, chills, sweats, unintended wt loss, pleuritic or exertional cp, hempoptysis, orthopnea pnd or change in chronic leg swelling. Also denies presyncope, palpitations, heartburn, abdominal pain, nausea, vomiting, diarrhea or change in bowel or urinary habits, dysuria,hematuria, rash, arthralgias, visual complaints, headache, numbness weakness or ataxia. ? ?   ?Objective:  ? Physical Exam ? ?Gen.  Pleasant, well-nourished, in no distress, normal affect ?ENT - no pallor,icterus, no post nasal drip ?Neck: No JVD, no thyromegaly, no carotid bruits ?Lungs: no use of accessory muscles, no dullness to percussion, dry rales Rt base , no rhonchi  ?Cardiovascular: Rhythm regular, heart sounds  normal, no murmurs or gallops, no peripheral edema ?Abdomen: soft and non-tender, no hepatosplenomegaly, BS normal. ?Musculoskeletal: No deformities, no cyanosis or clubbing ?Neuro:  alert, non focal ? ? ? ?   ?Assessment & Plan:  ? ? ?

## 2021-10-31 NOTE — Patient Instructions (Signed)
? ?  X refer to rheumatology Dr Ignacia Marvel  ? 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 ?Continue on prednisone, meantime ? ?X Rx for benzonatate 200 mg tid as needed #90 x 1 refill ?

## 2021-11-16 ENCOUNTER — Other Ambulatory Visit: Payer: Self-pay | Admitting: Gastroenterology

## 2021-11-16 DIAGNOSIS — R1011 Right upper quadrant pain: Secondary | ICD-10-CM

## 2021-11-23 ENCOUNTER — Other Ambulatory Visit: Payer: Medicare HMO

## 2021-11-28 ENCOUNTER — Ambulatory Visit (HOSPITAL_COMMUNITY)
Admission: RE | Admit: 2021-11-28 | Discharge: 2021-11-28 | Disposition: A | Discharge status: Home / Self Care | Payer: Medicare HMO | Source: Ambulatory Visit | Attending: Adult Health | Admitting: Adult Health

## 2021-11-28 DIAGNOSIS — R911 Solitary pulmonary nodule: Secondary | ICD-10-CM | POA: Diagnosis present

## 2021-11-28 DIAGNOSIS — M069 Rheumatoid arthritis, unspecified: Secondary | ICD-10-CM | POA: Insufficient documentation

## 2021-11-29 ENCOUNTER — Ambulatory Visit
Admission: RE | Admit: 2021-11-29 | Discharge: 2021-11-29 | Disposition: A | Discharge status: Home / Self Care | Payer: Medicare HMO | Source: Ambulatory Visit | Attending: Gastroenterology | Admitting: Gastroenterology

## 2021-11-29 DIAGNOSIS — R1011 Right upper quadrant pain: Secondary | ICD-10-CM

## 2021-12-04 ENCOUNTER — Other Ambulatory Visit: Payer: Self-pay | Admitting: Gastroenterology

## 2021-12-04 ENCOUNTER — Other Ambulatory Visit (HOSPITAL_COMMUNITY): Payer: Self-pay | Admitting: Gastroenterology

## 2021-12-04 DIAGNOSIS — R1011 Right upper quadrant pain: Secondary | ICD-10-CM

## 2021-12-07 ENCOUNTER — Telehealth: Payer: Self-pay | Admitting: Pulmonary Disease

## 2021-12-07 DIAGNOSIS — R911 Solitary pulmonary nodule: Secondary | ICD-10-CM

## 2021-12-07 NOTE — Telephone Encounter (Signed)
Melvenia Needles, NP  12/04/2021  5:41 PM EDT     CT chest shows slightly decreased right upper lobe nodule measuring 9 x 5 mm. We will need a follow-up CT chest in 3 months to further evaluate-CT without contrast   Stable ILD changes   Called and spoke with Danetta. She verbalized understanding and will relay the information to patient. Order for the follow up CT has been placed. Nothing further needed.

## 2021-12-07 NOTE — Telephone Encounter (Signed)
Tina Gray is leaving the country 12/10/2021.

## 2021-12-07 NOTE — Progress Notes (Signed)
ATC x1 on mobile #.  No answer.  LVM to return call. Called home #, interpreter service used (sign language).  Left message to return call.

## 2021-12-27 ENCOUNTER — Ambulatory Visit (HOSPITAL_COMMUNITY): Admission: RE | Admit: 2021-12-27 | Payer: Medicare HMO | Source: Ambulatory Visit

## 2022-01-15 ENCOUNTER — Ambulatory Visit (HOSPITAL_COMMUNITY)
Admission: RE | Admit: 2022-01-15 | Discharge: 2022-01-15 | Disposition: A | Discharge status: Home / Self Care | Payer: Medicare HMO | Source: Ambulatory Visit | Attending: Gastroenterology | Admitting: Gastroenterology

## 2022-01-15 DIAGNOSIS — R1011 Right upper quadrant pain: Secondary | ICD-10-CM | POA: Insufficient documentation

## 2022-01-15 MED ORDER — TECHNETIUM TC 99M MEBROFENIN IV KIT
5.0000 | PACK | Freq: Once | INTRAVENOUS | Status: AC | PRN
  Administered 2022-01-15: 5 via INTRAVENOUS

## 2022-01-30 ENCOUNTER — Ambulatory Visit
Admission: RE | Admit: 2022-01-30 | Discharge: 2022-01-30 | Disposition: A | Discharge status: Home / Self Care | Payer: Medicare HMO | Source: Ambulatory Visit | Attending: Nurse Practitioner | Admitting: Nurse Practitioner

## 2022-01-30 ENCOUNTER — Other Ambulatory Visit: Payer: Self-pay | Admitting: Nurse Practitioner

## 2022-01-30 DIAGNOSIS — M25552 Pain in left hip: Secondary | ICD-10-CM

## 2022-02-13 ENCOUNTER — Telehealth: Payer: Self-pay | Admitting: Pulmonary Disease

## 2022-02-13 NOTE — Telephone Encounter (Signed)
ATC pt left scheduling number [phone] on voice

## 2022-02-13 NOTE — Telephone Encounter (Signed)
She would have to call whoever ordered the Mammogram since it was not [place] if she needs to resc ct she can call [phone]

## 2022-02-15 ENCOUNTER — Ambulatory Visit (HOSPITAL_BASED_OUTPATIENT_CLINIC_OR_DEPARTMENT_OTHER)
Admission: RE | Admit: 2022-02-15 | Discharge: 2022-02-15 | Disposition: A | Discharge status: Home / Self Care | Payer: Medicare HMO | Source: Ambulatory Visit | Attending: Family Medicine | Admitting: Family Medicine

## 2022-02-15 DIAGNOSIS — R911 Solitary pulmonary nodule: Secondary | ICD-10-CM | POA: Diagnosis present

## 2022-02-19 NOTE — Progress Notes (Unsigned)
Office Visit Note  Patient: Tina Gray             Date of Birth: 03/27/1966           MRN: [id number]             PCP: Willene Hatchet, NP Referring: Rigoberto Noel, MD Visit Date: 02/20/2022 Occupation: '@GUAROCC'$ @  Subjective:  No chief complaint on file.   History of Present Illness: Tina Gray is a 56 y.o. female here for rheumatoid arthritis previously seen with Dr. Trudie Reed. History also significant for ILD probable UIP pattern. Most likely suspected due to underlying RA but also apparently question of possible myositis in history as well.***   Activities of Daily Living:  Patient reports morning stiffness for *** {minute/hour:19697}.   Patient {ACTIONS;DENIES/REPORTS:21021675::"Denies"} nocturnal pain.  Difficulty dressing/grooming: {ACTIONS;DENIES/REPORTS:21021675::"Denies"} Difficulty climbing stairs: {ACTIONS;DENIES/REPORTS:21021675::"Denies"} Difficulty getting out of chair: {ACTIONS;DENIES/REPORTS:21021675::"Denies"} Difficulty using hands for taps, buttons, cutlery, and/or writing: {ACTIONS;DENIES/REPORTS:21021675::"Denies"}  No Rheumatology ROS completed.   PMFS History:  Patient Active Problem List   Diagnosis Date Noted   Chronic cough 08/10/2021   CAP (community acquired pneumonia) 08/10/2021   Rheumatoid arthritis ([place]) 08/10/2021   ILD (interstitial lung disease) ([place]) 11/28/2020   Rheumatoid arthritis flare ([place]) 11/28/2020   Dysphagia    Chest pain at rest 12/02/2015   HTN (hypertension) 12/02/2015   Hypokalemia 12/02/2015   Elevated troponin 12/02/2015   Chest pain 12/02/2015   Right arm pain 12/02/2015   Abnormal uterine bleeding 03/17/2013   Anemia, iron deficiency 03/05/2013    Past Medical History:  Diagnosis Date   Carpal tunnel syndrome    Chest pain    Deaf    GERD (gastroesophageal reflux disease)    Hypertension     Family History  Problem Relation Age of Onset   Heart attack Mother    Heart disease Mother    Cancer Father     Esophageal cancer Father    Colon cancer Neg Hx    Rectal cancer Neg Hx    Stomach cancer Neg Hx    Past Surgical History:  Procedure Laterality Date   ESOPHAGEAL MANOMETRY N/A 03/13/2016   Procedure: ESOPHAGEAL MANOMETRY (EM);  Surgeon: Doran Stabler, MD;  Location: [facility];  Service: Gastroenterology;  Laterality: N/A;   LAPAROSCOPIC OVARIAN CYSTECTOMY     Social History   Social History Narrative   Not on file   Immunization History  Administered Date(s) Administered   Influenza,inj,Quad PF,6+ Mos 03/15/2013     Objective: Vital Signs: LMP 06/10/2014    Physical Exam   Musculoskeletal Exam: ***  CDAI Exam: CDAI Score: -- Patient Global: --; Provider Global: -- Swollen: --; Tender: -- Joint Exam 02/20/2022   No joint exam has been documented for this visit   There is currently no information documented on the homunculus. Go to the Rheumatology activity and complete the homunculus joint exam.  Investigation: No additional findings.  Imaging: CT CHEST WO CONTRAST  Result Date: 02/17/2022 CLINICAL DATA:  Follow-up pulmonary nodule EXAM: CT CHEST WITHOUT CONTRAST TECHNIQUE: Multidetector CT imaging of the chest was performed following the standard protocol without IV contrast. RADIATION DOSE REDUCTION: This exam was performed according to the departmental dose-optimization program which includes automated exposure control, adjustment of the mA and/or kV according to patient size and/or use of iterative reconstruction technique. COMPARISON:  CT scans of the chest August 20, 2021 and November 28, 2021 FINDINGS: Cardiovascular: Cardiomegaly. Minimal atherosclerotic changes in the thoracic aorta.  The central pulmonary artery is unremarkable. Mediastinum/Nodes: A prominent precarinal node measures 11 mm, stable, likely reactive. No adenopathy. No effusion. The thyroid and esophagus are normal. The chest wall is normal. Lungs/Pleura: Central airways are normal.  No  pneumothorax. The 9 by 5 mm nodule in the lateral aspect of the right apex on series 4, image 28 is stable. This nodule measures 10 x 7 mm on the August 20, 2021 chest CT. No other pulmonary nodules. No masses. No acute infiltrates. Widespread areas of ground-glass attenuation, septal thickening, thickening of the peribronchovascular interstitium, mild peripheral bronchiolectasis, and areas of architectural distortion with volume loss most pronounced in the mid lower lungs are stable consistent with interstitial lung disease. Upper Abdomen: No acute abnormality. Musculoskeletal: No chest wall mass or suspicious bone lesions identified. IMPRESSION: 1. Stable right apical pulmonary nodule. Recommend a follow-up chest CT in 6 months to ensure further resolution. The nodule has been stable for 6 months. 2. Findings compatible with interstitial lung disease, unchanged in the interval. 3. Mild cardiomegaly. 4. Mild atherosclerotic change in the nonaneurysmal thoracic aorta. Aortic Atherosclerosis (ICD10-I70.0). Electronically Signed   By: Dorise Bullion III M.D.   On: 02/17/2022 11:27   DG HIP UNILAT WITH PELVIS 2-3 VIEWS LEFT  Result Date: 01/30/2022 CLINICAL DATA:  Left hip pain EXAM: DG HIP (WITH OR WITHOUT PELVIS) 2-3V LEFT COMPARISON:  None Available. FINDINGS: Frontal and frogleg lateral views of the left hip are obtained. No acute fracture, subluxation, or dislocation. Mild joint space narrowing. Visualized portions of the left hemipelvis are unremarkable. Soft tissues are normal. IMPRESSION: 1. Mild left hip osteoarthritis.  No acute bony abnormality. Electronically Signed   By: Randa Ngo M.D.   On: 01/30/2022 19:28    Recent Labs: Lab Results  Component Value Date   WBC 8.5 11/06/2019   HGB 14.7 11/06/2019   PLT 221 11/06/2019   NA 138 04/14/2017   K 3.7 04/14/2017   CL 106 04/14/2017   CO2 26 04/14/2017   GLUCOSE 81 04/14/2017   BUN 9 04/14/2017   CREATININE 0.71 04/14/2017   BILITOT  0.8 04/14/2017   ALKPHOS 57 04/14/2017   AST 128 (H) 04/14/2017   ALT 137 (H) 04/14/2017   PROT 6.7 04/14/2017   ALBUMIN 3.6 04/14/2017   CALCIUM 9.1 04/14/2017   GFRAA >60 04/14/2017    Speciality Comments: No specialty comments available.  Procedures:  No procedures performed Allergies: Patient has no known allergies.   Assessment / Plan:     Visit Diagnoses: No diagnosis found.  Orders: No orders of the defined types were placed in this encounter.  No orders of the defined types were placed in this encounter.   Face-to-face time spent with patient was *** minutes. Greater than 50% of time was spent in counseling and coordination of care.  Follow-Up Instructions: No follow-ups on file.   Collier Salina, MD  Note - This record has been created using [employer].  Chart creation errors have been sought, but may not always  have been located. Such creation errors do not reflect on  the standard of medical care.

## 2022-02-20 ENCOUNTER — Ambulatory Visit: Payer: Medicare HMO | Attending: Internal Medicine | Admitting: Internal Medicine

## 2022-02-20 ENCOUNTER — Encounter: Payer: Self-pay | Admitting: Pulmonary Disease

## 2022-02-20 ENCOUNTER — Encounter: Payer: Self-pay | Admitting: Internal Medicine

## 2022-02-20 ENCOUNTER — Ambulatory Visit (INDEPENDENT_AMBULATORY_CARE_PROVIDER_SITE_OTHER): Payer: Medicare HMO | Admitting: Pulmonary Disease

## 2022-02-20 ENCOUNTER — Telehealth: Payer: Self-pay | Admitting: *Deleted

## 2022-02-20 VITALS — BP 100/65 | HR 57 | Resp 15 | Ht 65.0 in | Wt 147.8 lb

## 2022-02-20 VITALS — BP 120/68 | HR 60 | Temp 98.0°F | Ht 65.0 in | Wt 147.0 lb

## 2022-02-20 DIAGNOSIS — J849 Interstitial pulmonary disease, unspecified: Secondary | ICD-10-CM

## 2022-02-20 DIAGNOSIS — M1612 Unilateral primary osteoarthritis, left hip: Secondary | ICD-10-CM

## 2022-02-20 DIAGNOSIS — Z23 Encounter for immunization: Secondary | ICD-10-CM | POA: Diagnosis not present

## 2022-02-20 DIAGNOSIS — M069 Rheumatoid arthritis, unspecified: Secondary | ICD-10-CM

## 2022-02-20 DIAGNOSIS — M722 Plantar fascial fibromatosis: Secondary | ICD-10-CM

## 2022-02-20 DIAGNOSIS — R911 Solitary pulmonary nodule: Secondary | ICD-10-CM | POA: Insufficient documentation

## 2022-02-20 DIAGNOSIS — Z79899 Other long term (current) drug therapy: Secondary | ICD-10-CM

## 2022-02-20 LAB — CBC WITH DIFFERENTIAL/PLATELET
Basophils Absolute: 0 10*3/uL (ref 0.0–0.1)
Basophils Relative: 0.6 % (ref 0.0–3.0)
Eosinophils Absolute: 0.1 10*3/uL (ref 0.0–0.7)
Eosinophils Relative: 1.6 % (ref 0.0–5.0)
HCT: 41.9 % (ref 36.0–46.0)
Hemoglobin: 13.4 g/dL (ref 12.0–15.0)
Lymphocytes Relative: 8 % — ABNORMAL LOW (ref 12.0–46.0)
Lymphs Abs: 0.6 10*3/uL — ABNORMAL LOW (ref 0.7–4.0)
MCHC: 32.1 g/dL (ref 30.0–36.0)
MCV: 95.4 fl (ref 78.0–100.0)
Monocytes Absolute: 0.4 10*3/uL (ref 0.1–1.0)
Monocytes Relative: 4.9 % (ref 3.0–12.0)
Neutro Abs: 6.4 10*3/uL (ref 1.4–7.7)
Neutrophils Relative %: 84.9 % — ABNORMAL HIGH (ref 43.0–77.0)
Platelets: 239 10*3/uL (ref 150.0–400.0)
RBC: 4.39 Mil/uL (ref 3.87–5.11)
RDW: 13.4 % (ref 11.5–15.5)
WBC: 7.6 10*3/uL (ref 4.0–10.5)

## 2022-02-20 LAB — COMPREHENSIVE METABOLIC PANEL
ALT: 35 U/L (ref 0–35)
AST: 29 U/L (ref 0–37)
Albumin: 4 g/dL (ref 3.5–5.2)
Alkaline Phosphatase: 53 U/L (ref 39–117)
BUN: 14 mg/dL (ref 6–23)
CO2: 28 mEq/L (ref 19–32)
Calcium: 9.2 mg/dL (ref 8.4–10.5)
Chloride: 101 mEq/L (ref 96–112)
Creatinine, Ser: 0.8 mg/dL (ref 0.40–1.20)
GFR: 81.54 mL/min (ref >60.00)
Glucose, Bld: 89 mg/dL (ref 70–99)
Potassium: 3.5 mEq/L (ref 3.5–5.1)
Sodium: 137 mEq/L (ref 135–145)
Total Bilirubin: 0.4 mg/dL (ref 0.2–1.2)
Total Protein: 7.5 g/dL (ref 6.0–8.3)

## 2022-02-20 LAB — C-REACTIVE PROTEIN: CRP: 1.1 mg/dL (ref 0.5–20.0)

## 2022-02-20 LAB — CK: Total CK: 1178 U/L — ABNORMAL HIGH (ref 7–177)

## 2022-02-20 LAB — SEDIMENTATION RATE: Sed Rate: 5 mm/hr (ref 0–30)

## 2022-02-20 NOTE — Progress Notes (Signed)
Patient was seen in the office this morning by Dr Elsworth Soho.  Spoke with Beaulah Dinning (niece), listed on contacts in patient chart.  Provided results/recommendations per Dr. Elsworth Soho.  She verbalized understanding. Nothing further needed.

## 2022-02-20 NOTE — Progress Notes (Incomplete)
Office Visit Note  Patient: Tina Gray             Date of Birth: 19-Dec-1965           MRN: [id number]             PCP: Willene Hatchet, NP Referring: Rigoberto Noel, MD Visit Date: 02/20/2022   Subjective:  New Patient (Initial Visit) (Stopped prednisone, feet were swelling, weakness, pain and aching. Went back on prednisone and started feeling better. Feet still hurting though. Thigh tightness and pain.)   History of Present Illness: Tina Gray is a 56 y.o. female originally from [place] here for rheumatoid arthritis previously seen with Dr. Trudie Reed. 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  She has some chronic cough associated but does not get particularly short of breath at baseline or significantly limiting activities.  This ILD has been attributed most likely suspected due to underlying RA but also apparently question of possible myositis in history as well. Original onset of joint pains and swelling at multiple areas including hands hips feet originally evaluated for rheumatology in 2018.  She had treatment for bilateral carpal tunnel syndrome with good improvement of hand symptoms.  However since then has had persistent joint pains pretty much all the time if she is not on a sufficient dose of prednisone.  She was previously treated with a few DMARD medications.  She developed elevated liver function tests and went for GI evaluation had significant total work-up with no specific underlying cause for dysphagia and unclear autoimmune hepatitis involvement.  Hip xray 01/30/22 Mild OA  [facility] Started approximately 2017 B/l CTS B/l shoulder pain Previously on at least AZA and enbrel and prednisone, nothing but prednisone since at least 2 yearsor longer *** Elevated LFTs seen in GI clinic 2018   Currently pain all over if stopping prednisone on 10 mg daily B/l foot pain and left hip  pain ***  Activities of Daily Living:  Patient reports morning stiffness for 5 minutes.   Patient Denies nocturnal pain.  Difficulty dressing/grooming: Denies Difficulty climbing stairs: Denies Difficulty getting out of chair: Denies Difficulty using hands for taps, buttons, cutlery, and/or writing: Denies  Review of Systems  Constitutional:  Positive for fatigue.  HENT:  Negative for mouth sores and mouth dryness.   Eyes:  Positive for dryness.  Respiratory:  Positive for shortness of breath.   Cardiovascular:  Negative for chest pain and palpitations.  Gastrointestinal:  Negative for blood in stool, constipation and diarrhea.  Endocrine: Positive for increased urination.  Genitourinary:  Negative for involuntary urination.  Musculoskeletal:  Positive for joint pain, gait problem, joint pain, joint swelling, myalgias, muscle weakness, morning stiffness, muscle tenderness and myalgias.  Skin:  Negative for color change, hair loss and sensitivity to sunlight.  Allergic/Immunologic: Negative for susceptible to infections.  Neurological:  Negative for dizziness and headaches.  Hematological:  Negative for swollen glands.  Psychiatric/Behavioral:  Positive for sleep disturbance. Negative for depressed mood. The patient is not nervous/anxious.     PMFS History:  Patient Active Problem List   Diagnosis Date Noted  . Chronic cough 08/10/2021  . CAP (community acquired pneumonia) 08/10/2021  . Rheumatoid arthritis ([place]) 08/10/2021  . ILD (interstitial lung disease) ([place]) 11/28/2020  . Rheumatoid arthritis flare ([place]) 11/28/2020  . Dysphagia   . Chest pain at rest 12/02/2015  . HTN (hypertension) 12/02/2015  . Hypokalemia  12/02/2015  . Elevated troponin 12/02/2015  . Chest pain 12/02/2015  . Right arm pain 12/02/2015  . Abnormal uterine bleeding 03/17/2013  . Anemia, iron deficiency 03/05/2013    Past Medical History:  Diagnosis Date  . Carpal tunnel syndrome   . Chest pain    . Deaf   . GERD (gastroesophageal reflux disease)   . Hypertension     Family History  Problem Relation Age of Onset  . Heart attack Mother   . Heart disease Mother   . Cancer Father   . Esophageal cancer Father   . Prostate cancer Brother   . Healthy Son   . Healthy Son   . Colon cancer Neg Hx   . Rectal cancer Neg Hx   . Stomach cancer Neg Hx    Past Surgical History:  Procedure Laterality Date  . ESOPHAGEAL MANOMETRY N/A 03/13/2016   Procedure: ESOPHAGEAL MANOMETRY (EM);  Surgeon: Doran Stabler, MD;  Location: [facility];  Service: Gastroenterology;  Laterality: N/A;  . LAPAROSCOPIC OVARIAN CYSTECTOMY     Social History   Social History Narrative  . Not on file   Immunization History  Administered Date(s) Administered  . Influenza,inj,Quad PF,6+ Mos 03/15/2013     Objective: Vital Signs: BP 100/65 (BP Location: Left Arm, Patient Position: Sitting, Cuff Size: Normal)   Pulse (!) 57   Resp 15   Ht '5\' 5"'$  (1.651 m)   Wt 147 lb 12.8 oz (67 kg)   LMP 06/10/2014   BMI 24.60 kg/m    Physical Exam   Musculoskeletal Exam: ***  CDAI Exam: CDAI Score: -- Patient Global: --; Provider Global: -- Swollen: --; Tender: -- Joint Exam 02/20/2022   No joint exam has been documented for this visit   There is currently no information documented on the homunculus. Go to the Rheumatology activity and complete the homunculus joint exam.  Investigation: No additional findings.  Imaging: CT CHEST WO CONTRAST  Result Date: 02/17/2022 CLINICAL DATA:  Follow-up pulmonary nodule EXAM: CT CHEST WITHOUT CONTRAST TECHNIQUE: Multidetector CT imaging of the chest was performed following the standard protocol without IV contrast. RADIATION DOSE REDUCTION: This exam was performed according to the departmental dose-optimization program which includes automated exposure control, adjustment of the mA and/or kV according to patient size and/or use of iterative reconstruction technique.  COMPARISON:  CT scans of the chest August 20, 2021 and November 28, 2021 FINDINGS: Cardiovascular: Cardiomegaly. Minimal atherosclerotic changes in the thoracic aorta. The central pulmonary artery is unremarkable. Mediastinum/Nodes: A prominent precarinal node measures 11 mm, stable, likely reactive. No adenopathy. No effusion. The thyroid and esophagus are normal. The chest wall is normal. Lungs/Pleura: Central airways are normal.  No pneumothorax. The 9 by 5 mm nodule in the lateral aspect of the right apex on series 4, image 28 is stable. This nodule measures 10 x 7 mm on the August 20, 2021 chest CT. No other pulmonary nodules. No masses. No acute infiltrates. Widespread areas of ground-glass attenuation, septal thickening, thickening of the peribronchovascular interstitium, mild peripheral bronchiolectasis, and areas of architectural distortion with volume loss most pronounced in the mid lower lungs are stable consistent with interstitial lung disease. Upper Abdomen: No acute abnormality. Musculoskeletal: No chest wall mass or suspicious bone lesions identified. IMPRESSION: 1. Stable right apical pulmonary nodule. Recommend a follow-up chest CT in 6 months to ensure further resolution. The nodule has been stable for 6 months. 2. Findings compatible with interstitial lung disease, unchanged in the interval. 3.  Mild cardiomegaly. 4. Mild atherosclerotic change in the nonaneurysmal thoracic aorta. Aortic Atherosclerosis (ICD10-I70.0). Electronically Signed   By: Dorise Bullion III M.D.   On: 02/17/2022 11:27   DG HIP UNILAT WITH PELVIS 2-3 VIEWS LEFT  Result Date: 01/30/2022 CLINICAL DATA:  Left hip pain EXAM: DG HIP (WITH OR WITHOUT PELVIS) 2-3V LEFT COMPARISON:  None Available. FINDINGS: Frontal and frogleg lateral views of the left hip are obtained. No acute fracture, subluxation, or dislocation. Mild joint space narrowing. Visualized portions of the left hemipelvis are unremarkable. Soft tissues are normal.  IMPRESSION: 1. Mild left hip osteoarthritis.  No acute bony abnormality. Electronically Signed   By: Randa Ngo M.D.   On: 01/30/2022 19:28    Recent Labs: Lab Results  Component Value Date   WBC 8.5 11/06/2019   HGB 14.7 11/06/2019   PLT 221 11/06/2019   NA 138 04/14/2017   K 3.7 04/14/2017   CL 106 04/14/2017   CO2 26 04/14/2017   GLUCOSE 81 04/14/2017   BUN 9 04/14/2017   CREATININE 0.71 04/14/2017   BILITOT 0.8 04/14/2017   ALKPHOS 57 04/14/2017   AST 128 (H) 04/14/2017   ALT 137 (H) 04/14/2017   PROT 6.7 04/14/2017   ALBUMIN 3.6 04/14/2017   CALCIUM 9.1 04/14/2017   GFRAA >60 04/14/2017    Speciality Comments: No specialty comments available.  Procedures:  No procedures performed Allergies: Patient has no known allergies.   Assessment / Plan:     Visit Diagnoses: No diagnosis found.  Orders: No orders of the defined types were placed in this encounter.  No orders of the defined types were placed in this encounter.   Face-to-face time spent with patient was *** minutes. Greater than 50% of time was spent in counseling and coordination of care.  Follow-Up Instructions: No follow-ups on file.   Collier Salina, MD  Note - This record has been created using [employer].  Chart creation errors have been sought, but may not always  have been located. Such creation errors do not reflect on  the standard of medical care.

## 2022-02-20 NOTE — Progress Notes (Signed)
   Subjective:    Patient ID: Tina Gray, female    DOB: [birth year], 56 y.o.   MRN: [id number]  HPI  56 yo deaf, never smoker for FU of of ILD. PCP - [facility]    she was discharged from Dr. Trudie Reed' practice for missed appointments.   Lisinopril changed to ARB due to persistent cough She has been started on azathioprine due to hepatitis, also maintained on 5 mg prednisone She saw rheumatology today She is complains of sticky mucus production.  Albuterol makes her cough. Reviewed prior CT scans, new finding of left upper lobe nodule which appears to be stable compared to March CT scan   Significant tests/ events reviewed  PFTs 10/2021 shows mild to moderate restriction, ratio 85, FEV1 75%, FVC 70%, no bronchodilator response, TLC 73%, DLCO maneuver could not be completed HRCT in Feb 2023 > progression , probable UIP pattern    01/2022 Ct chest wo con >> Stable right apical pulmonary nodule , 9x 5 mm compared to 10 x 7 mm in Feb  CT chest / abd/pelvis w con 09/2020 >> Bilateral lower lung groundglass, reticulation, and traction bronchiectasis are present , no honeycombing , 'probable UIP'   HRCT 07/2021 - UIP pattern, progressive compared to 2017, 10 x 7 mm right upper lobe nodule CT chest w con 04/2016 >> Hazy diffuse ground-glass opacities in both lower lobes ? viral pneumonia, idiopathic interstitial pneumonia, sequela of alveolar edema or possibly hypersensitivity pneumonitis   Esophageal manometry 2017  Review of Systems neg for any significant sore throat, dysphagia, itching, sneezing, nasal congestion or excess/ purulent secretions, fever, chills, sweats, unintended wt loss, pleuritic or exertional cp, hempoptysis, orthopnea pnd or change in chronic leg swelling. Also denies presyncope, palpitations, heartburn, abdominal pain, nausea, vomiting, diarrhea or change in bowel or urinary habits, dysuria,hematuria, rash, arthralgias, visual  complaints, headache, numbness weakness or ataxia.     Objective:   Physical Exam   Gen. Pleasant, well-nourished, in no distress ENT - no thrush, no pallor/icterus,no post nasal drip Neck: No JVD, no thyromegaly, no carotid bruits Lungs: no use of accessory muscles, no dullness to percussion, few bibasilar dry rales no rhonchi  Cardiovascular: Rhythm regular, heart sounds  normal, no murmurs or gallops, no peripheral edema Musculoskeletal: No deformities, no cyanosis ,clubbing +        Assessment & Plan:

## 2022-02-20 NOTE — Assessment & Plan Note (Signed)
Stable over 6 months. We will reassess with HRCT in 6 months

## 2022-02-20 NOTE — Assessment & Plan Note (Signed)
Appears to be related to rheumatoid arthritis and dates back to 2017 She has been started on azathioprine and continues on prednisone. We will reassess with HRCT and PFTs in 6 months

## 2022-02-20 NOTE — Telephone Encounter (Signed)
Spoke with patient's niece this afternoon regarding DPR that is not on file for this patient.  I have looked in the chart and do not see it anywhere.  According to the niece she has filled it out multiple times.  The patient is deaf and does not have a telephone system to sign for her.  Can you please see if you can locate it?  Thank you.

## 2022-02-20 NOTE — Patient Instructions (Signed)
  Please take Mucinex 600 mg as needed for cough and sputum  All vaccinations are recommended -PCV 20 -COVID -Flu shot -RSV  X high-resolution CT chest in 6 months

## 2022-02-22 NOTE — Telephone Encounter (Signed)
Spoke with niece, informed that we do not have a DPR on file, only from 2017 for [facility]. Informed niece that the only reason we should not have it is if the document was not signed- then it is invalid. I informed niece we will give patient a DPR form at her next visit. Niece verbalized understanding.

## 2022-02-23 LAB — CYCLIC CITRUL PEPTIDE ANTIBODY, IGG: Cyclic Citrullin Peptide Ab: <16 UNITS

## 2022-02-23 LAB — HEPATITIS B SURFACE ANTIBODY,QUALITATIVE: Hep B S Ab: NONREACTIVE

## 2022-02-23 LAB — ANTI-NUCLEAR AB-TITER (ANA TITER): ANA Titer 1: 1:80 {titer} — ABNORMAL HIGH

## 2022-02-23 LAB — ANA: Anti Nuclear Antibody (ANA): POSITIVE — AB

## 2022-02-23 LAB — RHEUMATOID FACTOR: Rheumatoid fact SerPl-aCnc: <14 IU/mL (ref <14)

## 2022-02-23 LAB — HEPATITIS B CORE ANTIBODY, IGM: Hep B C IgM: NONREACTIVE

## 2022-02-25 LAB — QUANTIFERON-TB GOLD PLUS
Mitogen-NIL: 1.15 IU/mL
NIL: 0.04 IU/mL
QuantiFERON-TB Gold Plus: NEGATIVE
TB1-NIL: <0 IU/mL
TB2-NIL: 0 IU/mL

## 2022-02-27 ENCOUNTER — Telehealth: Payer: Self-pay | Admitting: Pulmonary Disease

## 2022-02-28 NOTE — Progress Notes (Signed)
Thanks for having her labs drawn, we were running late in clinic and I did not want to make her miss your appointment or have to return.  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

## 2022-03-01 NOTE — Telephone Encounter (Signed)
I called and got the interpreter service- had to leave msg- let her know that the CT was routed to Dr Owens Shark per her request. Nothing further needed.

## 2022-03-11 NOTE — Progress Notes (Signed)
Office Visit Note  Patient: Tina Gray             Date of Birth: 16-Nov-1965           MRN: [id number]             PCP: Willene Hatchet, NP Referring: Willene Hatchet, NP Visit Date: 03/20/2022   Subjective:  Follow-up (Here for results and questions.)   History of Present Illness: Tina Gray is a 56 y.o. female here for follow up for rheumatoid arthritis with associated ILD currently on prednisone 5 mg daily and azathioprine 50 mg daily.  We decreased the steroid dose from 10 mg after the last visit as symptoms appear well controlled and aiming to minimize side effect risks.  She has noticed some increased pain at multiple areas especially in both upper extremities and at the top of her neck since reducing the prednisone.  She is not typically seeing a lot of swelling but more pain or sensitivity to pressure.  The pain in her neck is right near the base of the skull describes tingling or burning in character and radiates along the back of her neck or back of her scalp periodically.  She particularly feels the worse symptoms when extending her neck.  Follow-up with pulmonology clinic Dr. Elsworth Soho review of most recent CT scan looks stable.  Lab work checked at last visit was negative for RA serology.  She did have a moderately elevated CK level 1178.  Liver function test have returned to a normal baseline.  Sedimentation rate and CRP were also normal.  She did have a positive ANA 1:80 titer.  Encounter was conducted with assistance of in person sign language interpreter  Previous HPI 02/20/2022 Tina Gray is a 56 y.o. female originally from [place] here for rheumatoid arthritis previously seen with Dr. Trudie Reed. 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  She has some chronic cough associated but does not get particularly short of breath at baseline or significantly limiting activities.  This ILD has  been attributed most likely suspected due to underlying RA but also apparently question of possible myositis in history as well. Original onset of joint pains and swelling at multiple areas including hands hips feet originally evaluated for rheumatology in 2018.  She had treatment for bilateral carpal tunnel syndrome with good improvement of hand symptoms.  However since then has had persistent joint pains pretty much all the time if she is not on a sufficient dose of prednisone.  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  Currently azathioprine 50 mg daily and prednisone 10 mg daily.  We will have some ongoing pain at her hip and feet but elsewhere doing fine and the symptoms are manageable while on the current steroid dose. She had gastroenterology evaluations as well in 2018 with abnormal LFTs but liver biopsy unremarkable for active inflammatory disease process.  Also significant work-up for dysphagia with manometry nonspecific for a cause.   Hip xray 01/30/22 Mild OA     Activities of Daily Living:  Patient reports morning stiffness for 5 minutes.   Patient Denies nocturnal pain.  Difficulty dressing/grooming: Denies Difficulty climbing stairs: Denies Difficulty getting out of chair: Denies Difficulty using hands for taps, buttons, cutlery, and/or writing: Denies   Review of Systems  Constitutional:  Negative for  fatigue.  HENT:  Negative for mouth sores and mouth dryness.   Eyes:  Positive for dryness.  Respiratory:  Negative for shortness of breath.   Cardiovascular:  Negative for chest pain and palpitations.  Gastrointestinal:  Negative for blood in stool, constipation and diarrhea.  Endocrine: Positive for increased urination.  Genitourinary:  Positive for involuntary urination.  Musculoskeletal:  Positive for joint pain, joint pain, joint swelling, myalgias, muscle weakness, morning  stiffness, muscle tenderness and myalgias. Negative for gait problem.  Skin:  Negative for color change, rash, hair loss and sensitivity to sunlight.  Allergic/Immunologic: Negative for susceptible to infections.  Neurological:  Negative for dizziness and headaches.  Hematological:  Negative for swollen glands.  Psychiatric/Behavioral:  Positive for sleep disturbance. Negative for depressed mood. The patient is not nervous/anxious.     PMFS History:  Patient Active Problem List   Diagnosis Date Noted   High risk medication use 02/20/2022   Unilateral primary osteoarthritis, left hip 02/20/2022   Bilateral plantar fasciitis 02/20/2022   Pulmonary nodule, left 02/20/2022   Chronic cough 08/10/2021   CAP (community acquired pneumonia) 08/10/2021   Rheumatoid arthritis ([place]) 08/10/2021   ILD (interstitial lung disease) ([place]) 11/28/2020   Rheumatoid arthritis flare ([place]) 11/28/2020   Dysphagia    Chest pain at rest 12/02/2015   HTN (hypertension) 12/02/2015   Hypokalemia 12/02/2015   Elevated troponin 12/02/2015   Chest pain 12/02/2015   Right arm pain 12/02/2015   Abnormal uterine bleeding 03/17/2013   Anemia, iron deficiency 03/05/2013    Past Medical History:  Diagnosis Date   Carpal tunnel syndrome    Chest pain    Deaf    GERD (gastroesophageal reflux disease)    Hypertension     Family History  Problem Relation Age of Onset   Heart attack Mother    Heart disease Mother    Cancer Father    Esophageal cancer Father    Prostate cancer Brother    Healthy Son    Healthy Son    Colon cancer Neg Hx    Rectal cancer Neg Hx    Stomach cancer Neg Hx    Past Surgical History:  Procedure Laterality Date   ESOPHAGEAL MANOMETRY N/A 03/13/2016   Procedure: ESOPHAGEAL MANOMETRY (EM);  Surgeon: Nelida Meuse III, MD;  Location: [facility];  Service: Gastroenterology;  Laterality: N/A;   LAPAROSCOPIC OVARIAN CYSTECTOMY     Social History   Social History Narrative   Not on  file   Immunization History  Administered Date(s) Administered   Influenza,inj,Quad PF,6+ Mos 03/15/2013   PNEUMOCOCCAL CONJUGATE-20 02/20/2022     Objective: Vital Signs: BP 115/72 (BP Location: Left Arm, Patient Position: Sitting, Cuff Size: Normal)   Pulse 69   Resp 15   Ht '5\' 6"'$  (1.676 m)   Wt 145 lb 9.6 oz (66 kg)   LMP 06/10/2014   BMI 23.50 kg/m    Physical Exam Cardiovascular:     Rate and Rhythm: Normal rate and regular rhythm.  Pulmonary:     Effort: Pulmonary effort is normal.     Breath sounds: Normal breath sounds.  Skin:    General: Skin is warm and dry.     Comments: Dry skin with hyperpigmentation along the dorsum of fingers on both hands Longitudinal melanotic changes on nails no pitting  Neurological:     Mental Status: She is alert.  Psychiatric:        Mood and Affect: Mood normal.  Musculoskeletal Exam:  Neck full ROM some pain provoked with full extension, mild tenderness to pressure to the paraspinal muscles at base of occiput Shoulders full ROM no tenderness or swelling Elbows full ROM no tenderness or swelling Wrists full ROM no tenderness or swelling Fingers full ROM no tenderness or swelling Midline and left paraspinal muscle tenderness at the lumbar spine and along iliac crest, no lateral hip tenderness to pressure on either side Knees full ROM no tenderness or swelling  Investigation: No additional findings.  Imaging: XR Cervical Spine With Flex & Extend  Result Date: 03/20/2022 X-ray cervical spine 4 views AP, lateral, and lateral views in flexion and extension Interval between the dens and odontoid process less than 3 mm in both flexion and extension views.  There are anterior osteophytes along the inferior endplate border at Z3-G6 levels.  There is disc height loss and retrolisthesis of C5 on C6.  No syndesmophytes or bridging osteophytes present.  No significant disc height loss outside of that 1 level. Impression Degenerative  changes at C5-C6 level most severe at C5-C6 where there is some retrolisthesis, no evidence suggesting atlantoaxial subluxation   Recent Labs: Lab Results  Component Value Date   WBC 7.6 02/20/2022   HGB 13.4 02/20/2022   PLT 239.0 02/20/2022   NA 137 02/20/2022   K 3.5 02/20/2022   CL 101 02/20/2022   CO2 28 02/20/2022   GLUCOSE 89 02/20/2022   BUN 14 02/20/2022   CREATININE 0.80 02/20/2022   BILITOT 0.4 02/20/2022   ALKPHOS 53 02/20/2022   AST 29 02/20/2022   ALT 35 02/20/2022   PROT 7.5 02/20/2022   ALBUMIN 4.0 02/20/2022   CALCIUM 9.2 02/20/2022   GFRAA >60 04/14/2017   QFTBGOLDPLUS NEGATIVE 02/20/2022    Speciality Comments: No specialty comments available.  Procedures:  No procedures performed Allergies: Patient has no known allergies.   Assessment / Plan:     Visit Diagnoses: Rheumatoid arthritis, involving unspecified site, unspecified whether rheumatoid factor present ([place]) - Plan: azaTHIOprine (IMURAN) 50 MG tablet  Does have joint inflammation but not much active synovitis present on exam today.  Possibly being suppressed by continued steroid treatment.  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  Considering involvement of joints, lungs, autoimmune hepatitis, and also with moderately elevated CK despite no weakness complaints.  Plan to titrate azathioprine dose as first step for steroid sparing medication we will increase to 100 mg daily continue the prednisone 5 mg daily at this time.  High risk medication use -  prednisone 5 mg every day and azathioprine 50 mg daily  Labs look fine with normal CBC and CMP at last visit.  ILD (interstitial lung disease) ([place]) - Plan: azaTHIOprine (IMURAN) 50 MG tablet  Lung disease appears to be stable by most recent 39-monthinterval CT monitoring.  Neck pain - Plan: XR Cervical Spine With Flex & Extend  Pain localizing in these superior portion of the cervical spine sounds more suggestive  for muscular problem and some cervicogenic headache.  X-rays obtained in clinic today to rule out atlantoaxial subluxation considering RA history but these look normal.  She has multilevel degenerative disease and some retrolisthesis but more in the middle to lower portion of the cervical spine does not correlate with the symptom complaint well today.  So I think this is more of a myofascial pain or muscular strain.  Orders: Orders Placed This Encounter  Procedures   XR Cervical Spine With Flex & Extend  Meds ordered this encounter  Medications   azaTHIOprine (IMURAN) 50 MG tablet    Sig: Take 2 tablets (100 mg total) by mouth daily.    Dispense:  180 tablet    Refill:  0     Follow-Up Instructions: Return in about 10 weeks (around 05/29/2022) for RA/ILD Overlap on AZA/GC f/u 2-47mo.   CCollier Salina MD  Note - This record has been created using [employer]  Chart creation errors have been sought, but may not always  have been located. Such creation errors do not reflect on  the standard of medical care.

## 2022-03-20 ENCOUNTER — Encounter: Payer: Self-pay | Admitting: Internal Medicine

## 2022-03-20 ENCOUNTER — Ambulatory Visit: Payer: Medicare HMO | Attending: Internal Medicine | Admitting: Internal Medicine

## 2022-03-20 ENCOUNTER — Ambulatory Visit (INDEPENDENT_AMBULATORY_CARE_PROVIDER_SITE_OTHER): Payer: Medicare HMO

## 2022-03-20 VITALS — BP 115/72 | HR 69 | Resp 15 | Ht 66.0 in | Wt 145.6 lb

## 2022-03-20 DIAGNOSIS — M1612 Unilateral primary osteoarthritis, left hip: Secondary | ICD-10-CM

## 2022-03-20 DIAGNOSIS — J849 Interstitial pulmonary disease, unspecified: Secondary | ICD-10-CM

## 2022-03-20 DIAGNOSIS — M069 Rheumatoid arthritis, unspecified: Secondary | ICD-10-CM | POA: Diagnosis not present

## 2022-03-20 DIAGNOSIS — Z79899 Other long term (current) drug therapy: Secondary | ICD-10-CM

## 2022-03-20 DIAGNOSIS — M542 Cervicalgia: Secondary | ICD-10-CM

## 2022-03-20 DIAGNOSIS — M722 Plantar fascial fibromatosis: Secondary | ICD-10-CM

## 2022-03-20 MED ORDER — AZATHIOPRINE 50 MG PO TABS: 100.0000 mg | ORAL_TABLET | Freq: Every day | ORAL | 0 refills | Status: DC

## 2022-03-20 NOTE — Patient Instructions (Addendum)
Your lab results and symptoms suggest an overlap syndrome of joint, muscle, liver, and lung inflammation.  The specific antibody test for rheumatoid arthritis is negative. But other antibody and markers for inflammation that are increased. The medication to treat this should be improving all of these symptoms.  Liver tests were back to normal level at our last visit.

## 2022-04-16 ENCOUNTER — Other Ambulatory Visit: Payer: Self-pay | Admitting: Internal Medicine

## 2022-04-16 ENCOUNTER — Telehealth: Payer: Self-pay | Admitting: Internal Medicine

## 2022-04-16 DIAGNOSIS — J849 Interstitial pulmonary disease, unspecified: Secondary | ICD-10-CM

## 2022-04-16 DIAGNOSIS — M069 Rheumatoid arthritis, unspecified: Secondary | ICD-10-CM

## 2022-04-16 NOTE — Telephone Encounter (Signed)
Patient called requesting prescription refill of Prednisone 5 mg tablets to be sent to [facility] at [address]  Patient states she is out of medication.

## 2022-04-17 ENCOUNTER — Telehealth: Payer: Self-pay | Admitting: Internal Medicine

## 2022-04-17 DIAGNOSIS — J849 Interstitial pulmonary disease, unspecified: Secondary | ICD-10-CM

## 2022-04-17 DIAGNOSIS — M069 Rheumatoid arthritis, unspecified: Secondary | ICD-10-CM

## 2022-04-17 NOTE — Telephone Encounter (Signed)
See MyChart message for details.

## 2022-04-17 NOTE — Telephone Encounter (Signed)
See MyChart notes.

## 2022-04-17 NOTE — Telephone Encounter (Signed)
Patient called stating she was returning your call regarding her refill.

## 2022-04-19 MED ORDER — AZATHIOPRINE 50 MG PO TABS: 100.0000 mg | ORAL_TABLET | Freq: Every day | ORAL | 0 refills | Status: DC

## 2022-04-19 MED ORDER — PREDNISONE 5 MG PO TABS: 5.0000 mg | ORAL_TABLET | Freq: Every day | ORAL | 0 refills | Status: DC

## 2022-04-19 NOTE — Telephone Encounter (Signed)
New Rx sent to [facility] pharmacy for prednisone and imuran

## 2022-05-29 ENCOUNTER — Encounter: Payer: Self-pay | Admitting: Internal Medicine

## 2022-05-29 ENCOUNTER — Ambulatory Visit: Payer: Medicare HMO | Attending: Internal Medicine | Admitting: Internal Medicine

## 2022-05-29 VITALS — BP 123/74 | HR 60 | Resp 14 | Ht 66.0 in | Wt 143.0 lb

## 2022-05-29 DIAGNOSIS — Z79899 Other long term (current) drug therapy: Secondary | ICD-10-CM | POA: Diagnosis not present

## 2022-05-29 DIAGNOSIS — J849 Interstitial pulmonary disease, unspecified: Secondary | ICD-10-CM

## 2022-05-29 DIAGNOSIS — M069 Rheumatoid arthritis, unspecified: Secondary | ICD-10-CM

## 2022-05-29 MED ORDER — AZATHIOPRINE 50 MG PO TABS: 150.0000 mg | ORAL_TABLET | Freq: Every day | ORAL | 0 refills | Status: DC

## 2022-05-29 MED ORDER — PREDNISONE 5 MG PO TABS: 5.0000 mg | ORAL_TABLET | Freq: Every day | ORAL | 0 refills | Status: DC

## 2022-05-29 NOTE — Progress Notes (Unsigned)
Office Visit Note  Patient: Tina Gray             Date of Birth: 09/14/1965           MRN: [id number]             PCP: Willene Hatchet, NP Referring: Willene Hatchet, NP Visit Date: 05/29/2022   Subjective:  Follow-up (Bil shoulder pain)   History of Present Illness: Tina Gray is a 56 y.o. female here for follow up for seropositive RA with associated ILD on prednisone imuran 100 mg daily and prednisone 5 mg daily.***   Rash on elbows/forearms Soreness at shoulders at deltoids/?supraspinatus insertion ***  Previous HPI 03/20/22 Tina Gray is a 56 y.o. female here for follow up for rheumatoid arthritis with associated ILD currently on prednisone 5 mg daily and azathioprine 50 mg daily.  We decreased the steroid dose from 10 mg after the last visit as symptoms appear well controlled and aiming to minimize side effect risks.  She has noticed some increased pain at multiple areas especially in both upper extremities and at the top of her neck since reducing the prednisone.  She is not typically seeing a lot of swelling but more pain or sensitivity to pressure.  The pain in her neck is right near the base of the skull describes tingling or burning in character and radiates along the back of her neck or back of her scalp periodically.  She particularly feels the worse symptoms when extending her neck.  Follow-up with pulmonology clinic Dr. Elsworth Soho review of most recent CT scan looks stable.  Lab work checked at last visit was negative for RA serology.  She did have a moderately elevated CK level 1178.  Liver function test have returned to a normal baseline.  Sedimentation rate and CRP were also normal.  She did have a positive ANA 1:80 titer.   Encounter was conducted with assistance of in person sign language interpreter   Previous HPI 02/20/2022 Tina Gray is a 56 y.o. female originally from [place] here for rheumatoid arthritis previously seen with Dr. Trudie Reed. 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  She has some chronic cough associated but does not get particularly short of breath at baseline or significantly limiting activities.  This ILD has been attributed most likely suspected due to underlying RA but also apparently question of possible myositis in history as well. Original onset of joint pains and swelling at multiple areas including hands hips feet originally evaluated for rheumatology in 2018.  She had treatment for bilateral carpal tunnel syndrome with good improvement of hand symptoms.  However since then has had persistent joint pains pretty much all the time if she is not on a sufficient dose of prednisone.  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  Currently azathioprine 50 mg daily and prednisone 10 mg daily.  We will have some ongoing pain at her hip and feet but elsewhere doing fine and the symptoms are manageable while on the current steroid dose. She had gastroenterology evaluations as well in 2018 with abnormal LFTs but liver biopsy unremarkable for active inflammatory disease process.  Also significant work-up for dysphagia with manometry nonspecific for a cause.   Hip xray 01/30/22 Mild OA   Review of Systems  Constitutional:  Positive for weight  loss. Negative for fatigue.  HENT:  Positive for mouth dryness. Negative for mouth sores.   Eyes:  Positive for dryness.  Respiratory:  Positive for shortness of breath.   Cardiovascular:  Negative for chest pain and palpitations.  Gastrointestinal:  Negative for blood in stool, constipation and diarrhea.  Endocrine: Positive for increased urination.  Genitourinary:  Positive for involuntary urination.  Musculoskeletal:  Positive for joint pain, joint pain, joint swelling, myalgias, muscle weakness, morning  stiffness, muscle tenderness and myalgias. Negative for gait problem.  Skin:  Positive for rash. Negative for color change, hair loss and sensitivity to sunlight.  Allergic/Immunologic: Negative for susceptible to infections.  Neurological:  Positive for dizziness. Negative for headaches.  Hematological:  Negative for swollen glands.  Psychiatric/Behavioral:  Positive for sleep disturbance. Negative for depressed mood. The patient is not nervous/anxious.     PMFS History:  Patient Active Problem List   Diagnosis Date Noted   High risk medication use 02/20/2022   Unilateral primary osteoarthritis, left hip 02/20/2022   Bilateral plantar fasciitis 02/20/2022   Pulmonary nodule, left 02/20/2022   Chronic cough 08/10/2021   CAP (community acquired pneumonia) 08/10/2021   Rheumatoid arthritis ([place]) 08/10/2021   ILD (interstitial lung disease) ([place]) 11/28/2020   Rheumatoid arthritis flare ([place]) 11/28/2020   Dysphagia    Chest pain at rest 12/02/2015   HTN (hypertension) 12/02/2015   Hypokalemia 12/02/2015   Elevated troponin 12/02/2015   Chest pain 12/02/2015   Right arm pain 12/02/2015   Abnormal uterine bleeding 03/17/2013   Anemia, iron deficiency 03/05/2013    Past Medical History:  Diagnosis Date   Carpal tunnel syndrome    Chest pain    Deaf    GERD (gastroesophageal reflux disease)    Hypertension    Rheumatoid arthritis ([place])     Family History  Problem Relation Age of Onset   Heart attack Mother    Heart disease Mother    Cancer Father    Esophageal cancer Father    Prostate cancer Brother    Healthy Son    Healthy Son    Colon cancer Neg Hx    Rectal cancer Neg Hx    Stomach cancer Neg Hx    Past Surgical History:  Procedure Laterality Date   ESOPHAGEAL MANOMETRY N/A 03/13/2016   Procedure: ESOPHAGEAL MANOMETRY (EM);  Surgeon: Nelida Meuse III, MD;  Location: [facility];  Service: Gastroenterology;  Laterality: N/A;   LAPAROSCOPIC OVARIAN CYSTECTOMY      Social History   Social History Narrative   Not on file   Immunization History  Administered Date(s) Administered   Influenza,inj,Quad PF,6+ Mos 03/15/2013   PNEUMOCOCCAL CONJUGATE-20 02/20/2022     Objective: Vital Signs: BP 123/74 (BP Location: Left Arm, Patient Position: Sitting, Cuff Size: Normal)   Pulse 60   Resp 14   Ht '5\' 6"'$  (1.676 m)   Wt 143 lb (64.9 kg)   LMP 06/10/2014   BMI 23.08 kg/m    Physical Exam   Musculoskeletal Exam: ***  CDAI Exam: CDAI Score: -- Patient Global: --; Provider Global: -- Swollen: --; Tender: -- Joint Exam 05/29/2022   No joint exam has been documented for this visit   There is currently no information documented on the homunculus. Go to the Rheumatology activity and complete the homunculus joint exam.  Investigation: No additional findings.  Imaging: No results found.  Recent Labs: Lab Results  Component Value Date   WBC 7.6 02/20/2022   HGB 13.4 02/20/2022  PLT 239.0 02/20/2022   NA 137 02/20/2022   K 3.5 02/20/2022   CL 101 02/20/2022   CO2 28 02/20/2022   GLUCOSE 89 02/20/2022   BUN 14 02/20/2022   CREATININE 0.80 02/20/2022   BILITOT 0.4 02/20/2022   ALKPHOS 53 02/20/2022   AST 29 02/20/2022   ALT 35 02/20/2022   PROT 7.5 02/20/2022   ALBUMIN 4.0 02/20/2022   CALCIUM 9.2 02/20/2022   GFRAA >60 04/14/2017   QFTBGOLDPLUS NEGATIVE 02/20/2022    Speciality Comments: No specialty comments available.  Procedures:  No procedures performed Allergies: Patient has no known allergies.   Assessment / Plan:     Visit Diagnoses: No diagnosis found.  ***  Orders: No orders of the defined types were placed in this encounter.  No orders of the defined types were placed in this encounter.    Follow-Up Instructions: No follow-ups on file.   Collier Salina, MD  Note - This record has been created using [employer].  Chart creation errors have been sought, but may not always  have been located.  Such creation errors do not reflect on  the standard of medical care.

## 2022-05-30 LAB — COMPLETE METABOLIC PANEL WITH GFR
AG Ratio: 1.3 (calc) (ref 1.0–2.5)
ALT: 13 U/L (ref 6–29)
AST: 19 U/L (ref 10–35)
Albumin: 4.4 g/dL (ref 3.6–5.1)
Alkaline phosphatase (APISO): 69 U/L (ref 37–153)
BUN: 11 mg/dL (ref 7–25)
CO2: 29 mmol/L (ref 20–32)
Calcium: 9.4 mg/dL (ref 8.6–10.4)
Chloride: 103 mmol/L (ref 98–110)
Creat: 0.82 mg/dL (ref 0.50–1.03)
Globulin: 3.4 g/dL (calc) (ref 1.9–3.7)
Glucose, Bld: 94 mg/dL (ref 65–99)
Potassium: 4.5 mmol/L (ref 3.5–5.3)
Sodium: 141 mmol/L (ref 135–146)
Total Bilirubin: 0.4 mg/dL (ref 0.2–1.2)
Total Protein: 7.8 g/dL (ref 6.1–8.1)
eGFR: 83 mL/min/{1.73_m2} (ref >=60)

## 2022-05-30 LAB — SEDIMENTATION RATE: Sed Rate: 6 mm/h (ref 0–30)

## 2022-05-30 LAB — CK: Total CK: 526 U/L — ABNORMAL HIGH (ref 29–143)

## 2022-05-30 LAB — CBC WITH DIFFERENTIAL/PLATELET
Absolute Monocytes: 248 cells/uL (ref 200–950)
Basophils Absolute: 29 cells/uL (ref 0–200)
Basophils Relative: 0.4 %
Eosinophils Absolute: 110 cells/uL (ref 15–500)
Eosinophils Relative: 1.5 %
HCT: 43 % (ref 35.0–45.0)
Hemoglobin: 13.9 g/dL (ref 11.7–15.5)
Lymphs Abs: 569 cells/uL — ABNORMAL LOW (ref 850–3900)
MCH: 30.3 pg (ref 27.0–33.0)
MCHC: 32.3 g/dL (ref 32.0–36.0)
MCV: 93.9 fL (ref 80.0–100.0)
MPV: 12.1 fL (ref 7.5–12.5)
Monocytes Relative: 3.4 %
Neutro Abs: 6344 cells/uL (ref 1500–7800)
Neutrophils Relative %: 86.9 %
Platelets: 236 10*3/uL (ref 140–400)
RBC: 4.58 10*6/uL (ref 3.80–5.10)
RDW: 12 % (ref 11.0–15.0)
Total Lymphocyte: 7.8 %
WBC: 7.3 10*3/uL (ref 3.8–10.8)

## 2022-06-03 NOTE — Progress Notes (Signed)
Labs look fine for continuing the azathioprine. Her lymphocyte count is mildly low but not enough to be a problem. CK improved from 1178 to 526 which is encouraging. I recommend taking the increased dose to 150 mg (3 tablets) azathioprine daily as planned and prednisone 5 mg daily.

## 2022-06-04 ENCOUNTER — Encounter: Payer: Self-pay | Admitting: *Deleted

## 2022-07-05 ENCOUNTER — Other Ambulatory Visit: Payer: Self-pay

## 2022-07-05 ENCOUNTER — Other Ambulatory Visit: Payer: Self-pay | Admitting: Internal Medicine

## 2022-07-05 DIAGNOSIS — J849 Interstitial pulmonary disease, unspecified: Secondary | ICD-10-CM

## 2022-07-05 DIAGNOSIS — M069 Rheumatoid arthritis, unspecified: Secondary | ICD-10-CM

## 2022-07-05 MED ORDER — AZATHIOPRINE 50 MG PO TABS
150.0000 mg | ORAL_TABLET | Freq: Every day | ORAL | 0 refills | Status: DC
  Filled 2022-07-05: qty 180, 60d supply, fill #0

## 2022-07-05 NOTE — Telephone Encounter (Signed)
Next Visit: 08/28/2022  Last Visit: 05/29/2022  DX: Rheumatoid arthritis, involving unspecified site, unspecified whether rheumatoid factor present   Current Dose per lab note 05/29/2022: increased dose to 150 mg (3 tablets) azathioprine daily   Labs: 05/29/2022 Labs look fine for continuing the azathioprine. Her lymphocyte count is mildly low but not enough to be a problem.   Okay to refill Imuran?

## 2022-07-09 ENCOUNTER — Other Ambulatory Visit: Payer: Self-pay

## 2022-08-04 ENCOUNTER — Other Ambulatory Visit: Payer: Self-pay

## 2022-08-08 ENCOUNTER — Encounter (HOSPITAL_BASED_OUTPATIENT_CLINIC_OR_DEPARTMENT_OTHER): Payer: Self-pay

## 2022-08-08 ENCOUNTER — Ambulatory Visit (HOSPITAL_BASED_OUTPATIENT_CLINIC_OR_DEPARTMENT_OTHER)
Admission: RE | Admit: 2022-08-08 | Discharge: 2022-08-08 | Disposition: A | Discharge status: Home / Self Care | Payer: Medicare HMO | Source: Ambulatory Visit | Attending: Pulmonary Disease | Admitting: Pulmonary Disease

## 2022-08-08 DIAGNOSIS — M069 Rheumatoid arthritis, unspecified: Secondary | ICD-10-CM | POA: Insufficient documentation

## 2022-08-08 DIAGNOSIS — R918 Other nonspecific abnormal finding of lung field: Secondary | ICD-10-CM | POA: Diagnosis not present

## 2022-08-21 ENCOUNTER — Ambulatory Visit (INDEPENDENT_AMBULATORY_CARE_PROVIDER_SITE_OTHER): Payer: Medicare HMO | Admitting: Pulmonary Disease

## 2022-08-21 ENCOUNTER — Encounter (HOSPITAL_BASED_OUTPATIENT_CLINIC_OR_DEPARTMENT_OTHER): Payer: Self-pay | Admitting: Pulmonary Disease

## 2022-08-21 VITALS — BP 108/64 | HR 58 | Temp 98.1°F | Ht 66.0 in | Wt 148.0 lb

## 2022-08-21 DIAGNOSIS — R053 Chronic cough: Secondary | ICD-10-CM | POA: Diagnosis not present

## 2022-08-21 DIAGNOSIS — R911 Solitary pulmonary nodule: Secondary | ICD-10-CM

## 2022-08-21 DIAGNOSIS — J849 Interstitial pulmonary disease, unspecified: Secondary | ICD-10-CM | POA: Diagnosis not present

## 2022-08-21 MED ORDER — HYDROCODONE BIT-HOMATROP MBR 5-1.5 MG/5ML PO SOLN: 5.0000 mL | Freq: Four times a day (QID) | ORAL | 0 refills | Status: DC | PRN

## 2022-08-21 MED ORDER — BENZONATATE 200 MG PO CAPS: 200.0000 mg | ORAL_CAPSULE | Freq: Three times a day (TID) | ORAL | 2 refills | Status: AC | PRN

## 2022-08-21 NOTE — Assessment & Plan Note (Signed)
We will provide her with Hycodan cough syrup, explained to her not to drive while taking this, benzonatate cough Perles She can also take OTC Delsym as needed

## 2022-08-21 NOTE — Assessment & Plan Note (Signed)
Right upper lobe nodule has been noted to be stable and likely related to scarring

## 2022-08-21 NOTE — Patient Instructions (Addendum)
Lung inflammation appears stable  x schedule PFTs  x Hycodan cough syrup will be sent to pharmacy x benzonatate 200 mg thrice daily as needed for coughing  COVID vaccination advised

## 2022-08-21 NOTE — Progress Notes (Signed)
   Subjective:    Patient ID: Tina Gray, female    DOB: Aug 25, 1965, 57 y.o.   MRN: [id number]  HPI  57  yo deaf, never smoker for FU of of CT -ILD.  overlap syndrome with the seronegative arthritis also some muscle inflammation and ILD and skin rash.  PCP - Jonelle Sidle Rheum - Tina Gray, Tina Gray Acie Fredrickson    she was discharged from Dr. Trudie Reed' practice for missed appointments.   Lisinopril changed to ARB due to persistent cough She was started on azathioprine due to hepatitis, also maintained on 5 mg prednisone  Chief Complaint  Patient presents with   Follow-up    Pt is here today to go over recent CT results. Pt said she has been taking allergy medication but is not having any real success with it due to still having problems with postnasal drainage. Pt has also has a cough with white phlegm but will have some green first thing in the morning.   37-monthfollow-up visit. Visit was conducted with deaf/mute interpreter She reports cough ongoing for a year. Rheumatology office visit reviewed, CK has decreased to 500 azathioprine has been titrated to 150 mg daily, she remains on 5 mg of prednisone. She reports that cough is improved with Hycodan cough syrup Best, albuterol only helped somewhat and she takes OTC Sambucol/elderberry tablets  Breathing is much improved and she can walk a good distance. She reports arthritis in her shoulders especially when she sleeps or lift some weights  She has not received COVID vaccination.  We reviewed HRCT results   Significant tests/ events reviewed  PFTs 10/2021 shows mild to moderate restriction, ratio 85, FEV1 75%, FVC 70%, no bronchodilator response, TLC 73%, DLCO maneuver could not be completed HRCT in Feb 2023 > progression , probable UIP pattern     HRCT 07/2022 >> NSIP pattern, RUL nodule unchanged, probably benign  01/2022 Ct chest wo con >> Stable right apical pulmonary nodule , 9x 5 mm compared to 10 x 7 mm in Feb  CT  chest / abd/pelvis w con 09/2020 >> Bilateral lower lung groundglass, reticulation, and traction bronchiectasis are present , no honeycombing , 'probable UIP'   HRCT 07/2021 - UIP pattern, progressive compared to 2017, 10 x 7 mm right upper lobe nodule CT chest w con 04/2016 >> Hazy diffuse ground-glass opacities in both lower lobes ? viral pneumonia, idiopathic interstitial pneumonia, sequela of alveolar edema or possibly hypersensitivity pneumonitis   Esophageal manometry 2017  Review of Systems neg for any significant sore throat, dysphagia, itching, sneezing, nasal congestion or excess/ purulent secretions, fever, chills, sweats, unintended wt loss, pleuritic or exertional cp, hempoptysis, orthopnea pnd or change in chronic leg swelling. Also denies presyncope, palpitations, heartburn, abdominal pain, nausea, vomiting, diarrhea or change in bowel or urinary habits, dysuria,hematuria, rash, arthralgias, visual complaints, headache, numbness weakness or ataxia.     Objective:   Physical Exam  Gen. Pleasant, well-nourished, in no distress ENT - no thrush, no pallor/icterus,no post nasal drip Neck: No JVD, no thyromegaly, no carotid bruits Lungs: no use of accessory muscles, no dullness to percussion, clear without rales or rhonchi  Cardiovascular: Rhythm regular, heart sounds  normal, no murmurs or gallops, no peripheral edema Musculoskeletal: No deformities, no cyanosis or clubbing        Assessment & Plan:

## 2022-08-21 NOTE — Assessment & Plan Note (Signed)
This dates back to 2017 in favor NSIP pattern which would be consistent with CT ILD. 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 She is being closely followed by rheumatology and maintained on azathioprine and prednisone. Will obtain PFTs but ILD seems stable on imaging

## 2022-08-23 ENCOUNTER — Ambulatory Visit (INDEPENDENT_AMBULATORY_CARE_PROVIDER_SITE_OTHER): Payer: Medicare HMO | Admitting: Pulmonary Disease

## 2022-08-23 DIAGNOSIS — R053 Chronic cough: Secondary | ICD-10-CM

## 2022-08-23 DIAGNOSIS — J849 Interstitial pulmonary disease, unspecified: Secondary | ICD-10-CM | POA: Diagnosis not present

## 2022-08-23 LAB — PULMONARY FUNCTION TEST
FEF 25-75 Post: 2.31 L/sec
FEF 25-75 Pre: 1.94 L/sec
FEF2575-%Change-Post: 19 %
FEF2575-%Pred-Post: 91 %
FEF2575-%Pred-Pre: 76 %
FEV1-%Change-Post: 3 %
FEV1-%Pred-Post: 66 %
FEV1-%Pred-Pre: 64 %
FEV1-Post: 1.83 L
FEV1-Pre: 1.77 L
FEV1FVC-%Change-Post: 2 %
FEV1FVC-%Pred-Pre: 106 %
FEV6-%Change-Post: 0 %
FEV6-%Pred-Post: 62 %
FEV6-%Pred-Pre: 61 %
FEV6-Post: 2.14 L
FEV6-Pre: 2.12 L
FEV6FVC-%Change-Post: 0 %
FEV6FVC-%Pred-Post: 103 %
FEV6FVC-%Pred-Pre: 103 %
FVC-%Change-Post: 0 %
FVC-%Pred-Post: 60 %
FVC-%Pred-Pre: 59 %
FVC-Post: 2.14 L
FVC-Pre: 2.12 L
Post FEV1/FVC ratio: 85 %
Post FEV6/FVC ratio: 100 %
Pre FEV1/FVC ratio: 83 %
Pre FEV6/FVC Ratio: 100 %
RV % pred: 110 %
RV: 2.24 L
TLC % pred: 76 %
TLC: 4.05 L

## 2022-08-23 NOTE — Patient Instructions (Signed)
Attempted Full PFT, Pre/Post Spirometry and Plethysmography (lung Volumes) Performed. Pt unable to perform DLCO with Interpreter.

## 2022-08-23 NOTE — Progress Notes (Signed)
Attempted Full PFT, Pre/Post Spirometry and Plethysmography (lung Volumes) Performed. Pt unable to perform DLCO with Interpreter.

## 2022-08-27 NOTE — Progress Notes (Unsigned)
Office Visit Note  Patient: Tina Gray             Date of Birth: 01/29/1966           MRN: [id number]             PCP: Willene Hatchet, NP Referring: Willene Hatchet, NP Visit Date: 08/28/2022   Subjective:  No chief complaint on file.   History of Present Illness: Tina Gray is a 57 y.o. female here for follow up ***   Previous HPI 05/29/22 Tina Gray is a 58 y.o. female here for follow up for seropositive RA with associated ILD on prednisone imuran 100 mg daily and prednisone 5 mg daily.  Since decreasing the prednisone from 10 mg to 5 mg daily she has had increased soreness particularly at her bilateral shoulders.  This bothers her mostly throughout the day and a lot of time when trying to lift up with her arms or reach overhead.  She is also had some increase in rash on her elbows and forearms.  She states this has been previously attributed to psoriasis rash.  She uses a moisturizing lotion no other topical treatments.  No rash breaking out elsewhere on her body.  She is tolerating the azathioprine 100 mg without any noticeable side effect but has not seen an improvement in pain or fatigue or rash since increasing this.   Encounter was conducted with the assistance of in person sign language interpreter.   Previous HPI 03/20/22 Tina Gray is a 57 y.o. female here for follow up for rheumatoid arthritis with associated ILD currently on prednisone 5 mg daily and azathioprine 50 mg daily.  We decreased the steroid dose from 10 mg after the last visit as symptoms appear well controlled and aiming to minimize side effect risks.  She has noticed some increased pain at multiple areas especially in both upper extremities and at the top of her neck since reducing the prednisone.  She is not typically seeing a lot of swelling but more pain or sensitivity to pressure.  The pain in her neck is right near the base of the skull describes tingling or burning in character and radiates  along the back of her neck or back of her scalp periodically.  She particularly feels the worse symptoms when extending her neck.  Follow-up with pulmonology clinic Dr. Elsworth Soho review of most recent CT scan looks stable.  Lab work checked at last visit was negative for RA serology.  She did have a moderately elevated CK level 1178.  Liver function test have returned to a normal baseline.  Sedimentation rate and CRP were also normal.  She did have a positive ANA 1:80 titer.   Encounter was conducted with assistance of in person sign language interpreter   Previous HPI 02/20/2022 Tina Gray is a 57 y.o. female originally from [place] here for rheumatoid arthritis previously seen with Dr. Trudie Reed. 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  She has some chronic cough associated but does not get particularly short of breath at baseline or significantly limiting activities.  This ILD has been attributed most likely suspected due to underlying RA but also apparently question of possible myositis in history as well. Original onset of joint pains and swelling at multiple areas including hands hips feet originally evaluated for rheumatology in 2018.  She had treatment for bilateral carpal tunnel syndrome with  good improvement of hand symptoms.  However since then has had persistent joint pains pretty much all the time if she is not on a sufficient dose of prednisone.  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  Currently azathioprine 50 mg daily and prednisone 10 mg daily.  We will have some ongoing pain at her hip and feet but elsewhere doing fine and the symptoms are manageable while on the current steroid dose. She had gastroenterology evaluations as well in 2018 with abnormal LFTs but liver biopsy unremarkable for active inflammatory  disease process.  Also significant work-up for dysphagia with manometry nonspecific for a cause.   Hip xray 01/30/22 Mild OA   No Rheumatology ROS completed.   PMFS History:  Patient Active Problem List   Diagnosis Date Noted   High risk medication use 02/20/2022   Unilateral primary osteoarthritis, left hip 02/20/2022   Bilateral plantar fasciitis 02/20/2022   Pulmonary nodule, left 02/20/2022   Chronic cough 08/10/2021   Rheumatoid arthritis ([place]) 08/10/2021   ILD (interstitial lung disease) ([place]) 11/28/2020   Dysphagia    Chest pain at rest 12/02/2015   HTN (hypertension) 12/02/2015   Hypokalemia 12/02/2015   Elevated troponin 12/02/2015   Chest pain 12/02/2015   Right arm pain 12/02/2015   Abnormal uterine bleeding 03/17/2013   Anemia, iron deficiency 03/05/2013    Past Medical History:  Diagnosis Date   Carpal tunnel syndrome    Chest pain    Deaf    GERD (gastroesophageal reflux disease)    Hypertension    Rheumatoid arthritis ([place])     Family History  Problem Relation Age of Onset   Heart attack Mother    Heart disease Mother    Cancer Father    Esophageal cancer Father    Prostate cancer Brother    Healthy Son    Healthy Son    Colon cancer Neg Hx    Rectal cancer Neg Hx    Stomach cancer Neg Hx    Past Surgical History:  Procedure Laterality Date   ESOPHAGEAL MANOMETRY N/A 03/13/2016   Procedure: ESOPHAGEAL MANOMETRY (EM);  Surgeon: Nelida Meuse III, MD;  Location: [facility];  Service: Gastroenterology;  Laterality: N/A;   LAPAROSCOPIC OVARIAN CYSTECTOMY     Social History   Social History Narrative   Not on file   Immunization History  Administered Date(s) Administered   Influenza,inj,Quad PF,6+ Mos 03/15/2013   PNEUMOCOCCAL CONJUGATE-20 02/20/2022     Objective: Vital Signs: LMP 06/10/2014    Physical Exam   Musculoskeletal Exam: ***  CDAI Exam: CDAI Score: -- Patient Global: --; Provider Global: -- Swollen: --; Tender:  -- Joint Exam 08/28/2022   No joint exam has been documented for this visit   There is currently no information documented on the homunculus. Go to the Rheumatology activity and complete the homunculus joint exam.  Investigation: No additional findings.  Imaging: CT Chest High Resolution  Result Date: 08/08/2022 CLINICAL DATA:  Interstitial lung disease, history of rheumatoid arthritis EXAM: CT CHEST WITHOUT CONTRAST TECHNIQUE: Multidetector CT imaging of the chest was performed following the standard protocol without intravenous contrast. High resolution imaging of the lungs, as well as inspiratory and expiratory imaging, was performed. RADIATION DOSE REDUCTION: This exam was performed according to the departmental dose-optimization program which includes automated exposure control, adjustment of the mA and/or kV according to patient size and/or use of iterative reconstruction  technique. COMPARISON:  Chest CT dated February 15, 2022 FINDINGS: Cardiovascular: Cardiomegaly. No pericardial effusion. Normal caliber thoracic aorta with no atherosclerotic disease. Mediastinum/Nodes: Patulous esophagus. Thyroid is unremarkable. No pathologically enlarged lymph nodes seen in the chest. Lungs/Pleura: Central airways are patent. No evidence of air trapping. Lower lung and peribronchovascular predominant ground-glass and reticular opacities with areas of subpleural sparing seen in the left lower lobe. No evidence of honeycomb change. Solid nodular opacity of the right upper lobe measuring 9 x 5 mm on series 7, image 57, unchanged when compared with prior exams dating back to July 31, 2021, favored to be due to benign nodular pleural-parenchymal scarring. Upper Abdomen: No acute abnormality. Musculoskeletal: No chest wall mass or suspicious bone lesions identified. IMPRESSION: 1. Lower lung and peribronchovascular predominant ground-glass and reticular opacities, findings are likely due to connective tissue  disease related ILD in the setting of rheumatoid arthritis. 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 Findings are suggestive of an alternative diagnosis (not UIP) per consensus guidelines: Diagnosis of Idiopathic Pulmonary Fibrosis: An Official ATS/ERS/JRS/ALAT Clinical Practice Guideline. [place], Iss 5, [phone], Feb 22 2017. 2. Nodular opacity of the right upper lobe has been stable for over 1 year and is likely benign nodular pleural-parenchymal scarring. Electronically Signed   By: Yetta Glassman M.D.   On: 08/08/2022 16:10    Recent Labs: Lab Results  Component Value Date   WBC 7.3 05/29/2022   HGB 13.9 05/29/2022   PLT 236 05/29/2022   NA 141 05/29/2022   K 4.5 05/29/2022   CL 103 05/29/2022   CO2 29 05/29/2022   GLUCOSE 94 05/29/2022   BUN 11 05/29/2022   CREATININE 0.82 05/29/2022   BILITOT 0.4 05/29/2022   ALKPHOS 53 02/20/2022   AST 19 05/29/2022   ALT 13 05/29/2022   PROT 7.8 05/29/2022   ALBUMIN 4.0 02/20/2022   CALCIUM 9.4 05/29/2022   GFRAA >60 04/14/2017   QFTBGOLDPLUS NEGATIVE 02/20/2022    Speciality Comments: No specialty comments available.  Procedures:  No procedures performed Allergies: Patient has no known allergies.   Assessment / Plan:     Visit Diagnoses: No diagnosis found.  ***  Orders: No orders of the defined types were placed in this encounter.  No orders of the defined types were placed in this encounter.    Follow-Up Instructions: No follow-ups on file.   Collier Salina, MD  Note - This record has been created using [employer].  Chart creation errors have been sought, but may not always  have been located. Such creation errors do not reflect on  the standard of medical care.

## 2022-08-28 ENCOUNTER — Encounter: Payer: Self-pay | Admitting: Internal Medicine

## 2022-08-28 ENCOUNTER — Ambulatory Visit: Payer: Medicare HMO | Attending: Internal Medicine | Admitting: Internal Medicine

## 2022-08-28 VITALS — BP 105/64 | HR 60 | Resp 17 | Ht 66.0 in | Wt 147.0 lb

## 2022-08-28 DIAGNOSIS — J849 Interstitial pulmonary disease, unspecified: Secondary | ICD-10-CM

## 2022-08-28 DIAGNOSIS — M069 Rheumatoid arthritis, unspecified: Secondary | ICD-10-CM | POA: Diagnosis not present

## 2022-08-28 DIAGNOSIS — Z79899 Other long term (current) drug therapy: Secondary | ICD-10-CM | POA: Diagnosis not present

## 2022-08-28 DIAGNOSIS — R1319 Other dysphagia: Secondary | ICD-10-CM

## 2022-08-28 DIAGNOSIS — R053 Chronic cough: Secondary | ICD-10-CM

## 2022-08-29 ENCOUNTER — Encounter: Payer: Self-pay | Admitting: *Deleted

## 2022-08-29 ENCOUNTER — Encounter: Payer: Self-pay | Admitting: Pulmonary Disease

## 2022-08-29 LAB — CBC WITH DIFFERENTIAL/PLATELET
Absolute Monocytes: 498 cells/uL (ref 200–950)
Basophils Absolute: 28 cells/uL (ref 0–200)
Basophils Relative: 0.5 %
Eosinophils Absolute: 62 cells/uL (ref 15–500)
Eosinophils Relative: 1.1 %
HCT: 37.6 % (ref 35.0–45.0)
Hemoglobin: 12.6 g/dL (ref 11.7–15.5)
Lymphs Abs: 930 cells/uL (ref 850–3900)
MCH: 31.9 pg (ref 27.0–33.0)
MCHC: 33.5 g/dL (ref 32.0–36.0)
MCV: 95.2 fL (ref 80.0–100.0)
MPV: 11.9 fL (ref 7.5–12.5)
Monocytes Relative: 8.9 %
Neutro Abs: 4082 cells/uL (ref 1500–7800)
Neutrophils Relative %: 72.9 %
Platelets: 254 10*3/uL (ref 140–400)
RBC: 3.95 10*6/uL (ref 3.80–5.10)
RDW: 12.3 % (ref 11.0–15.0)
Total Lymphocyte: 16.6 %
WBC: 5.6 10*3/uL (ref 3.8–10.8)

## 2022-08-29 LAB — COMPLETE METABOLIC PANEL WITH GFR
AG Ratio: 1.3 (calc) (ref 1.0–2.5)
ALT: 26 U/L (ref 6–29)
AST: 23 U/L (ref 10–35)
Albumin: 4 g/dL (ref 3.6–5.1)
Alkaline phosphatase (APISO): 71 U/L (ref 37–153)
BUN: 13 mg/dL (ref 7–25)
CO2: 26 mmol/L (ref 20–32)
Calcium: 8.9 mg/dL (ref 8.6–10.4)
Chloride: 105 mmol/L (ref 98–110)
Creat: 0.76 mg/dL (ref 0.50–1.03)
Globulin: 3.2 g/dL (calc) (ref 1.9–3.7)
Glucose, Bld: 133 mg/dL — ABNORMAL HIGH (ref 65–99)
Potassium: 4.4 mmol/L (ref 3.5–5.3)
Sodium: 140 mmol/L (ref 135–146)
Total Bilirubin: 0.5 mg/dL (ref 0.2–1.2)
Total Protein: 7.2 g/dL (ref 6.1–8.1)
eGFR: 91 mL/min/{1.73_m2} (ref >=60)

## 2022-08-29 LAB — CK: Total CK: 282 U/L — ABNORMAL HIGH (ref 29–143)

## 2022-08-29 LAB — SEDIMENTATION RATE: Sed Rate: 17 mm/h (ref 0–30)

## 2022-08-29 MED ORDER — AZATHIOPRINE 50 MG PO TABS: 150.0000 mg | ORAL_TABLET | Freq: Every day | ORAL | 0 refills | Status: DC

## 2022-08-29 MED ORDER — PREDNISONE 5 MG PO TABS: 5.0000 mg | ORAL_TABLET | Freq: Every day | ORAL | 0 refills | Status: DC

## 2022-08-29 NOTE — Progress Notes (Signed)
Lab results look great. Her CK is 282 this is down from 526 last time and 1178 before that. I do not recommend increasing any additional medication as this is nearly normal. Her white blood cell count is normal now. No other problems for continuing azathioprine.

## 2022-10-27 ENCOUNTER — Emergency Department (HOSPITAL_BASED_OUTPATIENT_CLINIC_OR_DEPARTMENT_OTHER): Payer: Medicare HMO

## 2022-10-27 ENCOUNTER — Other Ambulatory Visit: Payer: Self-pay

## 2022-10-27 ENCOUNTER — Encounter (HOSPITAL_BASED_OUTPATIENT_CLINIC_OR_DEPARTMENT_OTHER): Payer: Self-pay | Admitting: Emergency Medicine

## 2022-10-27 ENCOUNTER — Emergency Department (HOSPITAL_BASED_OUTPATIENT_CLINIC_OR_DEPARTMENT_OTHER)
Admission: EM | Admit: 2022-10-27 | Discharge: 2022-10-27 | Disposition: A | Discharge status: Home / Self Care | Payer: Medicare HMO | Attending: Emergency Medicine | Admitting: Emergency Medicine

## 2022-10-27 DIAGNOSIS — S92514A Nondisplaced fracture of proximal phalanx of right lesser toe(s), initial encounter for closed fracture: Secondary | ICD-10-CM | POA: Insufficient documentation

## 2022-10-27 DIAGNOSIS — Z79899 Other long term (current) drug therapy: Secondary | ICD-10-CM | POA: Insufficient documentation

## 2022-10-27 DIAGNOSIS — I10 Essential (primary) hypertension: Secondary | ICD-10-CM | POA: Diagnosis not present

## 2022-10-27 DIAGNOSIS — S90934A Unspecified superficial injury of right lesser toe(s), initial encounter: Secondary | ICD-10-CM | POA: Diagnosis present

## 2022-10-27 DIAGNOSIS — X58XXXA Exposure to other specified factors, initial encounter: Secondary | ICD-10-CM | POA: Insufficient documentation

## 2022-10-27 DIAGNOSIS — S92513A Displaced fracture of proximal phalanx of unspecified lesser toe(s), initial encounter for closed fracture: Secondary | ICD-10-CM

## 2022-10-27 MED ORDER — LIDOCAINE HCL (PF) 1 % IJ SOLN
5.0000 mL | Freq: Once | INTRAMUSCULAR | Status: AC
  Administered 2022-10-27: 5 mL
  Filled 2022-10-27: qty 5

## 2022-10-27 NOTE — Discharge Instructions (Addendum)
Today your x-ray showed that you have a displaced fracture in your right pinky toe.  Your toe was buddy wrapped and placed in a boot in the emergency department today.  Please keep your foot in a boot as much as possible and follow-up with podiatrist I have attached your for you for long-term management.  You may take Tylenol every 6 hours as needed for pain.  If symptoms worsen please return to ER.

## 2022-10-27 NOTE — ED Triage Notes (Signed)
Pt c/o right pinky toe pain after stubbing her toe. Pt is deaf, son at bedside interpreting

## 2022-10-27 NOTE — ED Provider Notes (Signed)
[place] EMERGENCY DEPARTMENT AT [facility] Provider Note   CSN: [id number] Arrival date & time: 10/27/22  2118     History  Chief Complaint  Patient presents with   Toe Injury    Tina Gray is a 57 y.o. female history of death, hypertension, interstitial lung disease, RA presented after stubbing her toe.  She stubbed her right pinky toe on a table leg and noticed an obvious deformity.  Patient states that she can still walk and feel however hurts to move with toe.  Patient does not note any signs of open fracture and states she is only in pain when the distal end of the toes manipulated.  Son present to translate  Patient denied skin color changes, loss of sensation, edema pain out of proportion  Home Medications Prior to Admission medications   Medication Sig Start Date End Date Taking? Authorizing Provider  albuterol (VENTOLIN HFA) 108 (90 Base) MCG/ACT inhaler Inhale 2 puffs into the lungs every 6 (six) hours as needed for wheezing or shortness of breath. 09/07/21   Glenford Bayley, NP  amLODipine (NORVASC) 10 MG tablet  08/03/20   [provider]  atorvastatin (LIPITOR) 10 MG tablet Take 10 mg by mouth daily. 08/19/22   [provider]  azaTHIOprine (IMURAN) 50 MG tablet Take 3 tablets (150 mg total) by mouth daily. 08/29/22   Rice, Jamesetta Orleans, MD  benzonatate (TESSALON) 200 MG capsule Take 1 capsule (200 mg total) by mouth 3 (three) times daily as needed for cough. Patient not taking: Reported on 08/28/2022 08/21/22   Oretha Milch, MD  famotidine (PEPCID) 20 MG tablet TAKE 1 TABLET BY MOUTH EVERYDAY AT BEDTIME 10/01/21   Glenford Bayley, NP  HYDROcodone bit-homatropine (HYCODAN) 5-1.5 MG/5ML syrup Take 5 mLs by mouth every 6 (six) hours as needed for cough. 08/21/22   Oretha Milch, MD  meloxicam (MOBIC) 15 MG tablet Take 15 mg by mouth. 02/26/17   [provider]  olmesartan (BENICAR) 20 MG tablet Take 1 tablet (20 mg total) by mouth  daily. 11/28/20   Oretha Milch, MD  predniSONE (DELTASONE) 5 MG tablet Take 1 tablet (5 mg total) by mouth daily with breakfast. 08/29/22   Rice, Jamesetta Orleans, MD      Allergies    Patient has no known allergies.    Review of Systems   Review of Systems See HPI Physical Exam Updated Vital Signs BP 127/73   Pulse (!) 57   Temp 98.4 F (36.9 C)   Resp 18   LMP 06/10/2014   SpO2 100%  Physical Exam Constitutional:      General: She is not in acute distress. Cardiovascular:     Pulses: Normal pulses.     Comments: 2+ bilateral dorsalis pedal pulses Musculoskeletal:        General: Deformity present.     Comments: Right fifth digit on foot: Angulated laterally at the distal end of the toe, no signs of open fracture, able to move toe at MTP joint, soft compartments  Skin:    General: Skin is warm and dry.     Capillary Refill: Capillary refill takes less than 2 seconds.     Comments: No overlying skin color changes  Neurological:     Mental Status: She is alert.     Comments: Sensation intact distally     ED Results / Procedures / Treatments   Labs (all labs ordered are listed, but only abnormal results are  displayed) Labs Reviewed - No data to display  EKG None  Radiology DG Toe 5th Right  Result Date: 10/27/2022 CLINICAL DATA:  Right little toe injury, stubbed toe EXAM: RIGHT FIFTH TOE COMPARISON:  None available FINDINGS: There is a fracture through the proximal phalanx of the right little toe. Lateral displacement and angulation of the distal fragment. No subluxation or dislocation. IMPRESSION: Displaced fracture through the proximal phalanx of the right little toe. Electronically Signed   By: Charlett Nose M.D.   On: 10/27/2022 22:06    Procedures Procedures    Medications Ordered in ED Medications  lidocaine (PF) (XYLOCAINE) 1 % injection 5 mL (5 mLs Other Given 10/27/22 2241)    ED Course/ Medical Decision Making/ A&P                             Medical  Decision Making Amount and/or Complexity of Data Reviewed Radiology: ordered.   Tina Gray 57 y.o. presented today for right pinky toe deformity. Working DDx that I considered at this time includes, but not limited to, toe fracture, neurovascular, eyes, ischemic limb, compartment syndrome, open fracture.  R/o DDx: neurovascular, eyes, ischemic limb, compartment syndrome, open fracture: These are considered less likely due to history of present illness and physical exam findings  Review of prior external notes: 08/21/2022 office visit  Unique Tests and My Interpretation:  Right fifth toe x-ray: Displaced fracture through proximal phalanx of fifth digit  Discussion with Independent Historian:  Son  Discussion of Management of Tests: None  Risk: Low: based on diagnostic testing/clinical impression and treatment plan  Risk Stratification Score: None  Plan: Patient presented for right fifth toe deformity. On exam patient was no acute distress and stable vitals.  Son was present to translate for patient.  On exam patient did have obvious deformity without signs of open fracture or compartment syndrome or neurovascular compromise.  X-ray did show a displaced fracture of the fifth digit and so fracture will be reduced after toe is anesthetized with lidocaine 2 ensure the toe heals properly. Toe was unable to be successfully reduced in ER. Patient will be placed in a boot after her toes are buddy wrapped as she is often on her feet and given podiatry follow-up.  Asked patient about pain meds and patient denied any pain meds.  Encourage patient to follow-up with podiatry regarding her fracture and that if she began to experience tightness in the area, skin color changes, loss of sensation, exquisite pain or other worsening symptoms to return to the ER.  Patient was given return precautions. Patient stable for discharge at this time.  Patient verbalized understanding of plan.         Final  Clinical Impression(s) / ED Diagnoses Final diagnoses:  Displaced fracture of proximal phalanx of unspecified lesser toe(s), initial encounter for closed fracture    Rx / DC Orders ED Discharge Orders     None         Remi Deter 10/27/22 2310    Lonell Grandchild, MD 10/30/22 [phone]

## 2022-10-28 ENCOUNTER — Ambulatory Visit (INDEPENDENT_AMBULATORY_CARE_PROVIDER_SITE_OTHER): Payer: Medicare HMO

## 2022-10-28 ENCOUNTER — Encounter: Payer: Self-pay | Admitting: Podiatry

## 2022-10-28 ENCOUNTER — Ambulatory Visit (INDEPENDENT_AMBULATORY_CARE_PROVIDER_SITE_OTHER): Payer: Medicare HMO | Admitting: Podiatry

## 2022-10-28 DIAGNOSIS — S92531A Displaced fracture of distal phalanx of right lesser toe(s), initial encounter for closed fracture: Secondary | ICD-10-CM

## 2022-10-28 NOTE — Progress Notes (Signed)
   Chief Complaint  Patient presents with   Foot Injury    Patient came in today for a right foot injury that happen yesterday, patient ran in to a door and hit the toe, rate of pain 7 out of 10, seen at ER X-Rays done yesterday and Cam Boot     HPI: 57 y.o. female presenting today as a new patient referral from the ED for evaluation of a fracture of the right fifth digit that occurred yesterday, 10/27/2022.  She was seen in the ER and x-rays were taken.  Reduction was attempted but I do not see any postreduction x-rays taken.  Was referred here and presents for further treatment and evaluation  Past Medical History:  Diagnosis Date   Carpal tunnel syndrome    Chest pain    Deaf    GERD (gastroesophageal reflux disease)    Hypertension    Rheumatoid arthritis ([facility])     Past Surgical History:  Procedure Laterality Date   ESOPHAGEAL MANOMETRY N/A 03/13/2016   Procedure: ESOPHAGEAL MANOMETRY (EM);  Surgeon: Sherrilyn Rist, MD;  Location: [facility];  Service: Gastroenterology;  Laterality: N/A;   LAPAROSCOPIC OVARIAN CYSTECTOMY      No Known Allergies   Physical Exam: General: The patient is alert and oriented x3 in no acute distress.  Dermatology: Skin is warm, dry and supple bilateral lower extremities.   Vascular: Palpable pedal pulses bilaterally. Capillary refill within normal limits.  No appreciable edema.  No erythema.  Neurological: Grossly intact via light touch  Musculoskeletal Exam: No pedal deformities noted.  Clinically there is decent alignment of the toe after reduction and taping.  Radiographic Exam RT foot postreduction x-rays 10/28/2022:  Mild displacement of the proximal phalanx head of the fifth digit right foot.  Overall decent alignment of the toe.  Assessment/Plan of Care: 1. Fracture RT fifth toe proximal phalanx;   -Postreduction x-rays do not appear to have been taken after the toe was reduced in the ED yesterday.  X-rays today continue to  demonstrate some displacement. -Decision made to attempt reduction again today.  The toe was prepped in aseptic manner and digital block performed using 3 mL lidocaine plain.  The toe was distracted and taped to the adjacent digits.  Post x-rays do demonstrate some slight reduction. -Continue daily taping to the adjacent digit -Continue WBAT cam boot -Note for work was provided today.  Return to work 10/30/2022 in the cam boot.  Rest as needed. -Return to clinic 6 weeks follow-up x-ray     Felecia Shelling, DPM [facility]  Dr. Felecia Shelling, DPM    [address] [place], [place] [address]                Office [phone]  Fax [phone]

## 2022-10-29 ENCOUNTER — Other Ambulatory Visit: Payer: Self-pay | Admitting: Podiatry

## 2022-10-29 ENCOUNTER — Telehealth: Payer: Self-pay | Admitting: Podiatry

## 2022-10-29 MED ORDER — IBUPROFEN 800 MG PO TABS
800.0000 mg | ORAL_TABLET | Freq: Three times a day (TID) | ORAL | 1 refills | Status: DC
Start: 2022-10-29 — End: 2023-06-05

## 2022-10-29 NOTE — Telephone Encounter (Signed)
Pt is requesting her Rx Ib 800 for pain.    Please advise

## 2022-11-05 ENCOUNTER — Other Ambulatory Visit: Payer: Self-pay | Admitting: Podiatry

## 2022-11-05 DIAGNOSIS — S92531A Displaced fracture of distal phalanx of right lesser toe(s), initial encounter for closed fracture: Secondary | ICD-10-CM

## 2022-11-25 ENCOUNTER — Other Ambulatory Visit: Payer: Self-pay | Admitting: Internal Medicine

## 2022-11-25 DIAGNOSIS — J849 Interstitial pulmonary disease, unspecified: Secondary | ICD-10-CM

## 2022-11-25 DIAGNOSIS — M069 Rheumatoid arthritis, unspecified: Secondary | ICD-10-CM

## 2022-11-25 NOTE — Telephone Encounter (Signed)
Last Fill: 08/29/2022  Labs: 08/28/2022 Lab results look great. Her CK is 282 this is down from 526 last time and 1178 before that. I do not recommend increasing any additional medication as this is nearly normal. Her white blood cell count is normal now. No other problems for continuing azathioprine.   Next Visit: 12/02/2022  Last Visit: 08/28/2022  DX: Rheumatoid arthritis, involving unspecified site, unspecified whether rheumatoid factor present   Current Dose per office note 08/28/2022: Imuran 150 mg daily   Okay to refill Imuran?

## 2022-12-01 NOTE — Progress Notes (Unsigned)
Office Visit Note  Patient: Tina Gray             Date of Birth: 06-23-1966           MRN: [id number]             PCP: Estevan Oaks, NP Referring: Estevan Oaks, NP Visit Date: 12/02/2022   Subjective:  No chief complaint on file.   History of Present Illness: Tina Gray is a 57 y.o. female here for follow up ***   Previous HPI 08/28/22 Tina Gray is a 57 y.o. female here for follow up for seropositive RA with overlap associated ILD and persistent elevated CK on Imuran 150 mg daily and prednisone 5 mg daily.  Azathioprine dose was increased after last visit due to some ongoing joint pains and abnormal lab results.  She is not noticed any problems since increasing the medication.  Still has right shoulder pain but less severe compared to at our visit in December.  No peripheral joint swelling.  Small amounts of skin rash intermittently but not causing a lot of symptoms.  She had recent follow-up with Dr. Vassie Loll in pulmonology clinic findings are stable.  Not experiencing much shortness of breath does have productive cough with sputum early each morning.   Encounter was conducted with the assistance of in person sign language interpreter.   Previous HPI 05/29/22 Tina Gray is a 57 y.o. female here for follow up for seropositive RA with associated ILD on prednisone imuran 100 mg daily and prednisone 5 mg daily.  Since decreasing the prednisone from 10 mg to 5 mg daily she has had increased soreness particularly at her bilateral shoulders.  This bothers her mostly throughout the day and a lot of time when trying to lift up with her arms or reach overhead.  She is also had some increase in rash on her elbows and forearms.  She states this has been previously attributed to psoriasis rash.  She uses a moisturizing lotion no other topical treatments.  No rash breaking out elsewhere on her body.  She is tolerating the azathioprine 100 mg without any noticeable side effect but has not  seen an improvement in pain or fatigue or rash since increasing this.     Previous HPI 03/20/22 Tina Gray is a 57 y.o. female here for follow up for rheumatoid arthritis with associated ILD currently on prednisone 5 mg daily and azathioprine 50 mg daily.  We decreased the steroid dose from 10 mg after the last visit as symptoms appear well controlled and aiming to minimize side effect risks.  She has noticed some increased pain at multiple areas especially in both upper extremities and at the top of her neck since reducing the prednisone.  She is not typically seeing a lot of swelling but more pain or sensitivity to pressure.  The pain in her neck is right near the base of the skull describes tingling or burning in character and radiates along the back of her neck or back of her scalp periodically.  She particularly feels the worse symptoms when extending her neck.  Follow-up with pulmonology clinic Dr. Vassie Loll review of most recent CT scan looks stable.  Lab work checked at last visit was negative for RA serology.  She did have a moderately elevated CK level 1178.  Liver function test have returned to a normal baseline.  Sedimentation rate and CRP were also normal.  She did have a positive ANA  1:80 titer.     Previous HPI 02/20/2022 Tina Gray is a 57 y.o. female originally from [place] here for rheumatoid arthritis previously seen with Dr. Nickola Major. 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  She has some chronic cough associated but does not get particularly short of breath at baseline or significantly limiting activities.  This ILD has been attributed most likely suspected due to underlying RA but also apparently question of possible myositis in history as well. Original onset of joint pains and swelling at multiple areas including hands hips feet originally evaluated for rheumatology in 2018.  She had treatment  for bilateral carpal tunnel syndrome with good improvement of hand symptoms.  However since then has had persistent joint pains pretty much all the time if she is not on a sufficient dose of prednisone.  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  Currently azathioprine 50 mg daily and prednisone 10 mg daily.  We will have some ongoing pain at her hip and feet but elsewhere doing fine and the symptoms are manageable while on the current steroid dose. She had gastroenterology evaluations as well in 2018 with abnormal LFTs but liver biopsy unremarkable for active inflammatory disease process.  Also significant work-up for dysphagia with manometry nonspecific for a cause.   Hip xray 01/30/22 Mild OA   No Rheumatology ROS completed.   PMFS History:  Patient Active Problem List   Diagnosis Date Noted   High risk medication use 02/20/2022   Unilateral primary osteoarthritis, left hip 02/20/2022   Bilateral plantar fasciitis 02/20/2022   Pulmonary nodule, left 02/20/2022   Chronic cough 08/10/2021   Rheumatoid arthritis (HCC) 08/10/2021   ILD (interstitial lung disease) (HCC) 11/28/2020   Dysphagia    Chest pain at rest 12/02/2015   HTN (hypertension) 12/02/2015   Hypokalemia 12/02/2015   Elevated troponin 12/02/2015   Chest pain 12/02/2015   Right arm pain 12/02/2015   Abnormal uterine bleeding 03/17/2013   Anemia, iron deficiency 03/05/2013    Past Medical History:  Diagnosis Date   Carpal tunnel syndrome    Chest pain    Deaf    GERD (gastroesophageal reflux disease)    Hypertension    Rheumatoid arthritis (HCC)     Family History  Problem Relation Age of Onset   Heart attack Mother    Heart disease Mother    Cancer Father    Esophageal cancer Father    Prostate cancer Brother    Healthy Son    Healthy Son    Colon cancer Neg Hx    Rectal cancer Neg Hx    Stomach cancer Neg Hx     Past Surgical History:  Procedure Laterality Date   ESOPHAGEAL MANOMETRY N/A 03/13/2016   Procedure: ESOPHAGEAL MANOMETRY (EM);  Surgeon: Charlie Pitter III, MD;  Location: [facility];  Service: Gastroenterology;  Laterality: N/A;   LAPAROSCOPIC OVARIAN CYSTECTOMY     Social History   Social History Narrative   Not on file   Immunization History  Administered Date(s) Administered   Influenza,inj,Quad PF,6+ Mos 03/15/2013   PNEUMOCOCCAL CONJUGATE-20 02/20/2022     Objective: Vital Signs: LMP 06/10/2014    Physical Exam   Musculoskeletal Exam: ***  CDAI Exam: CDAI Score: -- Patient Global: --; Provider Global: -- Swollen: --; Tender: -- Joint Exam 12/02/2022   No joint  exam has been documented for this visit   There is currently no information documented on the homunculus. Go to the Rheumatology activity and complete the homunculus joint exam.  Investigation: No additional findings.  Imaging: DG Foot Complete Right  Result Date: 11/05/2022 Please see detailed radiograph report in office note.  DG Foot Complete Right  Result Date: 11/05/2022 Please see detailed radiograph report in office note.   Recent Labs: Lab Results  Component Value Date   WBC 5.6 08/28/2022   HGB 12.6 08/28/2022   PLT 254 08/28/2022   NA 140 08/28/2022   K 4.4 08/28/2022   CL 105 08/28/2022   CO2 26 08/28/2022   GLUCOSE 133 (H) 08/28/2022   BUN 13 08/28/2022   CREATININE 0.76 08/28/2022   BILITOT 0.5 08/28/2022   ALKPHOS 53 02/20/2022   AST 23 08/28/2022   ALT 26 08/28/2022   PROT 7.2 08/28/2022   ALBUMIN 4.0 02/20/2022   CALCIUM 8.9 08/28/2022   GFRAA >60 04/14/2017   QFTBGOLDPLUS NEGATIVE 02/20/2022    Speciality Comments: No specialty comments available.  Procedures:  No procedures performed Allergies: Patient has no known allergies.   Assessment / Plan:     Visit Diagnoses: No diagnosis found.  ***  Orders: No orders of the defined types were placed in this  encounter.  No orders of the defined types were placed in this encounter.    Follow-Up Instructions: No follow-ups on file.   Fuller Plan, MD  Note - This record has been created using [employer].  Chart creation errors have been sought, but may not always  have been located. Such creation errors do not reflect on  the standard of medical care.

## 2022-12-02 ENCOUNTER — Ambulatory Visit: Payer: Medicare HMO | Attending: Internal Medicine | Admitting: Internal Medicine

## 2022-12-02 ENCOUNTER — Encounter: Payer: Self-pay | Admitting: Internal Medicine

## 2022-12-02 VITALS — BP 121/81 | HR 76 | Resp 14 | Ht 66.0 in | Wt 150.0 lb

## 2022-12-02 DIAGNOSIS — J849 Interstitial pulmonary disease, unspecified: Secondary | ICD-10-CM | POA: Diagnosis not present

## 2022-12-02 DIAGNOSIS — M069 Rheumatoid arthritis, unspecified: Secondary | ICD-10-CM

## 2022-12-02 DIAGNOSIS — Z79899 Other long term (current) drug therapy: Secondary | ICD-10-CM | POA: Diagnosis not present

## 2022-12-02 LAB — CBC WITH DIFFERENTIAL/PLATELET
Absolute Monocytes: 650 cells/uL (ref 200–950)
Eosinophils Absolute: 131 cells/uL (ref 15–500)
HCT: 39.6 % (ref 35.0–45.0)
Hemoglobin: 13 g/dL (ref 11.7–15.5)
MCHC: 32.8 g/dL (ref 32.0–36.0)
Platelets: 247 10*3/uL (ref 140–400)
RDW: 12.2 % (ref 11.0–15.0)

## 2022-12-03 LAB — COMPLETE METABOLIC PANEL WITH GFR
AG Ratio: 1.5 (calc) (ref 1.0–2.5)
ALT: 53 U/L — ABNORMAL HIGH (ref 6–29)
AST: 37 U/L — ABNORMAL HIGH (ref 10–35)
Albumin: 4.6 g/dL (ref 3.6–5.1)
Alkaline phosphatase (APISO): 78 U/L (ref 37–153)
BUN: 12 mg/dL (ref 7–25)
CO2: 30 mmol/L (ref 20–32)
Calcium: 9.5 mg/dL (ref 8.6–10.4)
Chloride: 105 mmol/L (ref 98–110)
Creat: 0.89 mg/dL (ref 0.50–1.03)
Globulin: 3 g/dL (calc) (ref 1.9–3.7)
Glucose, Bld: 94 mg/dL (ref 65–99)
Potassium: 4.6 mmol/L (ref 3.5–5.3)
Sodium: 143 mmol/L (ref 135–146)
Total Bilirubin: 0.5 mg/dL (ref 0.2–1.2)
Total Protein: 7.6 g/dL (ref 6.1–8.1)
eGFR: 75 mL/min/{1.73_m2} (ref >=60)

## 2022-12-03 LAB — CBC WITH DIFFERENTIAL/PLATELET
Basophils Absolute: 40 cells/uL (ref 0–200)
Basophils Relative: 0.7 %
Eosinophils Relative: 2.3 %
Lymphs Abs: 1197 cells/uL (ref 850–3900)
MCH: 31.7 pg (ref 27.0–33.0)
MCV: 96.6 fL (ref 80.0–100.0)
MPV: 12 fL (ref 7.5–12.5)
Monocytes Relative: 11.4 %
Neutro Abs: 3682 cells/uL (ref 1500–7800)
Neutrophils Relative %: 64.6 %
RBC: 4.1 10*6/uL (ref 3.80–5.10)
Total Lymphocyte: 21 %
WBC: 5.7 10*3/uL (ref 3.8–10.8)

## 2022-12-03 LAB — CK: Total CK: 431 U/L — ABNORMAL HIGH (ref 29–143)

## 2022-12-03 LAB — SEDIMENTATION RATE: Sed Rate: 9 mm/h (ref 0–30)

## 2022-12-03 NOTE — Progress Notes (Signed)
CK of 431 this is slightly higher than 282 last time. Sedimentation rate is normal.  Liver enzyme AST and ALT are slightly increased. I think she is okay to continue current medication.

## 2022-12-09 ENCOUNTER — Ambulatory Visit (INDEPENDENT_AMBULATORY_CARE_PROVIDER_SITE_OTHER): Payer: Medicare HMO | Admitting: Podiatry

## 2022-12-09 ENCOUNTER — Ambulatory Visit (INDEPENDENT_AMBULATORY_CARE_PROVIDER_SITE_OTHER): Payer: Medicare HMO

## 2022-12-09 DIAGNOSIS — S92531A Displaced fracture of distal phalanx of right lesser toe(s), initial encounter for closed fracture: Secondary | ICD-10-CM | POA: Diagnosis not present

## 2022-12-09 NOTE — Progress Notes (Signed)
   Chief Complaint  Patient presents with   Toe Pain    "It's a follow-up appointment.  It has improved a little, it's still a little swollen."    HPI: 57 y.o. female presenting today for follow-up evaluation of a fracture to the right fifth digit that occurred on 10/27/2022.  Patient doing very well.  She says the pain has mostly resolved.  She is no longer taking any anti-inflammatory pain medication.  Currently wearing crocs  Past Medical History:  Diagnosis Date   Carpal tunnel syndrome    Chest pain    Deaf    GERD (gastroesophageal reflux disease)    Hypertension    Rheumatoid arthritis (HCC)     Past Surgical History:  Procedure Laterality Date   ESOPHAGEAL MANOMETRY N/A 03/13/2016   Procedure: ESOPHAGEAL MANOMETRY (EM);  Surgeon: Sherrilyn Rist, MD;  Location: [facility];  Service: Gastroenterology;  Laterality: N/A;   LAPAROSCOPIC OVARIAN CYSTECTOMY      No Known Allergies   Physical Exam: General: The patient is alert and oriented x3 in no acute distress.  Dermatology: Skin is warm, dry and supple bilateral lower extremities.   Vascular: Palpable pedal pulses bilaterally. Capillary refill within normal limits.  No appreciable edema.  No erythema.  Neurological: Grossly intact via light touch  Musculoskeletal Exam: No pedal deformities noted.  Good alignment of the digits.  There is no gross deformity or malalignment noted.  Minimal tenderness with palpation  Radiographic Exam RT foot 12/09/2022 Good alignment of the fifth toe.  Routine healing noted to the fifth digit as well as the fourth digit  Assessment/Plan of Care: 1. Fracture RT fifth and fourth toe proximal phalanx; closed nondisplaced with routine healing  - Patient evaluated.  X-rays reviewed -Note for work was provided.  Patient may return to work full activity no restrictions beginning 12/08/2022 -Recommend good supportive tennis shoes and sneakers -Return to clinic as needed     Felecia Shelling, DPM [facility]  Dr. Felecia Shelling, DPM    [address] [place], [place] [address]                Office [phone]  Fax [phone]

## 2022-12-11 ENCOUNTER — Telehealth: Payer: Self-pay | Admitting: *Deleted

## 2022-12-11 NOTE — Telephone Encounter (Signed)
Patient contacted the office via [employer]. Interpreter [phone].   Patient contacted the office to reschedule her appointment on 03/04/2023 as she already had another appointment scheduled. Patient was rescheduled for 03/06/2023 at 11:00 am. Patient also asked about a nebulizer machine. Advised patient she would need to contact her PCP or pulmonologist for that. She expressed understanding.

## 2023-02-20 ENCOUNTER — Other Ambulatory Visit: Payer: Self-pay | Admitting: Internal Medicine

## 2023-02-20 DIAGNOSIS — M069 Rheumatoid arthritis, unspecified: Secondary | ICD-10-CM

## 2023-02-20 DIAGNOSIS — J849 Interstitial pulmonary disease, unspecified: Secondary | ICD-10-CM

## 2023-02-20 NOTE — Telephone Encounter (Signed)
Last Fill: 11/25/2022  Labs: 12/02/2022 CK of 431 this is slightly higher than 282 last time. Sedimentation rate is normal.  Liver enzyme AST and ALT are slightly increased. I think she is okay to continue current medication.   Next Visit: 03/06/2023  Last Visit: 12/02/2022  DX: Rheumatoid arthritis, involving unspecified site, unspecified whether rheumatoid factor present   Current Dose per office note 12/02/2022: azathioprine 150 mg daily   Patient to update her labs at upcoming appointment on 03/06/2023  Okay to refill Imuran?

## 2023-02-25 NOTE — Progress Notes (Signed)
Office Visit Note  Patient: Tina Gray             Date of Birth: Dec 26, 1965           MRN: [id number]             PCP: Estevan Oaks, NP Referring: Estevan Oaks, NP Visit Date: 03/06/2023   Subjective:  Follow-up (Patient states she feels like she pulled or twisted something in her knees this past summer. Patient states her feet have been swelling off and on. )   History of Present Illness: Tina Gray is a 57 y.o. 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  Last CK level was slightly more elevated over 400 but without definite corresponding symptoms.  Currently has some increased pain in both knees.  Tenderness is mostly towards the medial side of the joint.  Especially with certain twisting movements.  Not seeing any visible swelling.  She broke her right fifth toe forcibly opening a door onto it evaluated by podiatry this has improved with no particular intervention.  Also has pain on the bottoms of both heels this is most intense first thing in the morning and worse on left foot.  No cough or shortness of breath.  Encounter was conducted with the assistance of in person sign language interpreter.  Previous HPI 12/02/2022 Tina Gray is a 57 y.o. 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 Since our last visit she broke her right 5th toe by shutting a door on it and had this reduced externally. Has some increased pain in the shoulders and in right knee. Not noticing visible swelling. Shoulder pain is worst when trying to pull or lift heavy weight. Knee pain feels worst at the back of the knee. She has had similar pain in the past for which she had steroid injections years ago, but had been doing well  off any specific treatment.   Previous HPI 08/28/22 Tina Gray is a 57 y.o. female here for follow up for seropositive RA with overlap associated ILD and persistent elevated CK on Imuran 150 mg daily and prednisone 5 mg daily.  Azathioprine dose was increased after last visit due to some ongoing joint pains and abnormal lab results.  She is not noticed any problems since increasing the medication.  Still has right shoulder pain but less severe compared to at our visit in December.  No peripheral joint swelling.  Small amounts of skin rash intermittently but not causing a lot of symptoms.  She had recent follow-up with Dr. Vassie Loll in pulmonology clinic findings are stable.  Not experiencing much shortness of breath does have productive cough with sputum early each morning.     Previous HPI 05/29/22 Tina Gray is a 58 y.o. female here for follow up for seropositive RA with associated ILD on prednisone imuran 100 mg daily and prednisone 5 mg daily.  Since decreasing the prednisone from 10 mg to 5 mg daily she has had increased soreness particularly at her bilateral shoulders.  This bothers her mostly throughout the day and a lot of time when trying to lift up with her arms or reach overhead.  She is also had some increase in  rash on her elbows and forearms.  She states this has been previously attributed to psoriasis rash.  She uses a moisturizing lotion no other topical treatments.  No rash breaking out elsewhere on her body.  She is tolerating the azathioprine 100 mg without any noticeable side effect but has not seen an improvement in pain or fatigue or rash since increasing this.     Previous HPI 03/20/22 Tina Gray is a 57 y.o. female here for follow up for rheumatoid arthritis with associated ILD currently on prednisone 5 mg daily and azathioprine 50 mg daily.  We decreased the steroid dose from 10 mg after the last visit as symptoms appear well controlled and aiming to minimize side effect risks.   She has noticed some increased pain at multiple areas especially in both upper extremities and at the top of her neck since reducing the prednisone.  She is not typically seeing a lot of swelling but more pain or sensitivity to pressure.  The pain in her neck is right near the base of the skull describes tingling or burning in character and radiates along the back of her neck or back of her scalp periodically.  She particularly feels the worse symptoms when extending her neck.  Follow-up with pulmonology clinic Dr. Vassie Loll review of most recent CT scan looks stable.  Lab work checked at last visit was negative for RA serology.  She did have a moderately elevated CK level 1178.  Liver function test have returned to a normal baseline.  Sedimentation rate and CRP were also normal.  She did have a positive ANA 1:80 titer.     Previous HPI 02/20/2022 Tina Gray is a 57 y.o. female originally from [place] here for rheumatoid arthritis previously seen with Dr. Nickola Major. 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  She has some chronic cough associated but does not get particularly short of breath at baseline or significantly limiting activities.  This ILD has been attributed most likely suspected due to underlying RA but also apparently question of possible myositis in history as well. Original onset of joint pains and swelling at multiple areas including hands hips feet originally evaluated for rheumatology in 2018.  She had treatment for bilateral carpal tunnel syndrome with good improvement of hand symptoms.  However since then has had persistent joint pains pretty much all the time if she is not on a sufficient dose of prednisone.  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  Currently azathioprine 50 mg daily and prednisone 10 mg  daily.  We will have some ongoing pain at her hip and feet but elsewhere doing fine and the symptoms are manageable while on the current steroid dose. She had gastroenterology evaluations as well in 2018 with abnormal LFTs but liver biopsy unremarkable for active inflammatory disease process.  Also significant work-up for dysphagia with manometry nonspecific for a cause.   Hip xray 01/30/22 Mild OA   Review of Systems  Constitutional:  Negative for fatigue.  HENT:  Negative for mouth sores and mouth dryness.   Eyes:  Positive for dryness.  Respiratory:  Negative for shortness of breath.   Cardiovascular:  Negative for chest pain and palpitations.  Gastrointestinal:  Negative for blood in stool, constipation and diarrhea.  Endocrine: Positive for increased urination.  Genitourinary:  Positive for involuntary urination.  Musculoskeletal:  Positive for joint pain, joint pain, joint swelling, morning stiffness and muscle tenderness. Negative for gait problem, myalgias, muscle weakness and myalgias.  Skin:  Negative for color change, rash, hair loss and sensitivity to sunlight.  Allergic/Immunologic: Negative for susceptible to infections.  Neurological:  Negative for dizziness and headaches.  Hematological:  Negative for swollen glands.  Psychiatric/Behavioral:  Negative for depressed mood and sleep disturbance. The patient is not nervous/anxious.     PMFS History:  Patient Active Problem List   Diagnosis Date Noted   Bilateral knee pain 03/06/2023   High risk medication use 02/20/2022   Unilateral primary osteoarthritis, left hip 02/20/2022   Bilateral plantar fasciitis 02/20/2022   Pulmonary nodule, left 02/20/2022   Chronic cough 08/10/2021   Rheumatoid arthritis (HCC) 08/10/2021   ILD (interstitial lung disease) (HCC) 11/28/2020   Dysphagia    Chest pain at rest 12/02/2015   HTN (hypertension) 12/02/2015   Hypokalemia 12/02/2015   Elevated troponin 12/02/2015   Chest pain  12/02/2015   Right arm pain 12/02/2015   Abnormal uterine bleeding 03/17/2013   Anemia, iron deficiency 03/05/2013    Past Medical History:  Diagnosis Date   Carpal tunnel syndrome    Chest pain    Deaf    GERD (gastroesophageal reflux disease)    Hypertension    Rheumatoid arthritis (HCC)     Family History  Problem Relation Age of Onset   Heart attack Mother    Heart disease Mother    Cancer Father    Esophageal cancer Father    Prostate cancer Brother    Healthy Son    Healthy Son    Colon cancer Neg Hx    Rectal cancer Neg Hx    Stomach cancer Neg Hx    Past Surgical History:  Procedure Laterality Date   ESOPHAGEAL MANOMETRY N/A 03/13/2016   Procedure: ESOPHAGEAL MANOMETRY (EM);  Surgeon: Charlie Pitter III, MD;  Location: [facility];  Service: Gastroenterology;  Laterality: N/A;   LAPAROSCOPIC OVARIAN CYSTECTOMY     Social History   Social History Narrative   Not on file   Immunization History  Administered Date(s) Administered   Influenza,inj,Quad PF,6+ Mos 03/15/2013   PNEUMOCOCCAL CONJUGATE-20 02/20/2022     Objective: Vital Signs: BP 106/67 (BP Location: Left Arm, Patient Position: Sitting, Cuff Size: Normal)   Pulse (!) 56   Resp 14   Ht 5\' 6"  (1.676 m)   Wt 147 lb (66.7 kg)   LMP 06/10/2014   BMI 23.73 kg/m    Physical Exam HENT:     Mouth/Throat:     Mouth: Mucous membranes are moist.     Pharynx: Oropharynx is clear.  Eyes:     Conjunctiva/sclera: Conjunctivae normal.  Cardiovascular:     Rate and Rhythm: Normal rate and regular rhythm.  Pulmonary:     Effort: Pulmonary effort is normal.     Breath sounds: Normal breath sounds.  Musculoskeletal:     Right lower leg: No edema.     Left lower leg: No edema.  Lymphadenopathy:     Cervical: No cervical adenopathy.  Skin:    General: Skin is warm and dry.     Findings: No rash.  Neurological:     Mental Status: She is alert.  Psychiatric:        Mood and Affect: Mood normal.       Musculoskeletal Exam:  Shoulders full ROM no tenderness or swelling Elbows full ROM no tenderness or swelling  Wrists full ROM no tenderness or swelling Fingers full ROM no tenderness or swelling Mild left lateral hip tenderness with internal rotation and pressure Knees full ROM, no joint line tenderness, no pain with varus or valgus pressure, there is pain distal to the knee on medial anterior aspect of tibia without nodule or swelling Ankles full ROM no tenderness or swelling Tenderness on plantar side at anterior border of calcaneus   Investigation: No additional findings.  Imaging: No results found.  Recent Labs: Lab Results  Component Value Date   WBC 5.7 12/02/2022   HGB 13.0 12/02/2022   PLT 247 12/02/2022   NA 143 12/02/2022   K 4.6 12/02/2022   CL 105 12/02/2022   CO2 30 12/02/2022   GLUCOSE 94 12/02/2022   BUN 12 12/02/2022   CREATININE 0.89 12/02/2022   BILITOT 0.5 12/02/2022   ALKPHOS 53 02/20/2022   AST 37 (H) 12/02/2022   ALT 53 (H) 12/02/2022   PROT 7.6 12/02/2022   ALBUMIN 4.0 02/20/2022   CALCIUM 9.5 12/02/2022   GFRAA >60 04/14/2017   QFTBGOLDPLUS NEGATIVE 02/20/2022    Speciality Comments: No specialty comments available.  Procedures:  No procedures performed Allergies: Patient has no known allergies.   Assessment / Plan:     Visit Diagnoses: Rheumatoid arthritis, involving unspecified site, unspecified whether rheumatoid factor present (HCC) - Plan: CK, Sedimentation rate  Joint inflammation appears well-controlled current joint complaints believe more use related.  Has a mixed clinical picture with inflammatory arthritis, elevated CK, and ILD changes serology most consistent for RA.  Recheck CK and sedimentation rate for disease activity monitoring.  Plan to continue on azathioprine 150 mg daily and prednisone 5 mg daily.  High risk medication use - azathioprine 150 mg daily - Plan: CBC with Differential/Platelet, COMPLETE METABOLIC PANEL  WITH GFR  Checking CBC and CMP for medication monitoring on continued long-term use of azathioprine.  No serious interval infections.  Long term (current) use of systemic steroids - prednisone 5 mg daily  ILD (interstitial lung disease) (HCC)  No new pulmonary symptoms complaints.  High-resolution chest CT from February consistent for CTD associated ILD.  Bilateral plantar fasciitis  Some pain consistent with bilateral plantar fasciitis no appreciable nodule or fibroma.  Provided printed stretching exercises.  Chronic pain of both knees  Knee pain appears consistent with Pes anserine bursitis.  No specific provocative injury.  She cannot tolerate oral NSAIDs recommended trying to use topical diclofenac on the affected area.  Also provided printed strengthening and range of motion exercises.  Orders: Orders Placed This Encounter  Procedures   CK   Sedimentation rate   CBC with Differential/Platelet   COMPLETE METABOLIC PANEL WITH GFR   No orders of the defined types were placed in this encounter.    Follow-Up Instructions: Return in about 3 months (around 06/05/2023) for RA/ILD/bursitis on AZA/GC f/u 3mos.   Fuller Plan, MD  Note - This record has been created using [employer].  Chart creation errors have been sought, but may not always  have been located. Such creation errors do not reflect on  the standard of medical care.

## 2023-03-04 ENCOUNTER — Ambulatory Visit: Payer: Medicare HMO | Admitting: Internal Medicine

## 2023-03-06 ENCOUNTER — Ambulatory Visit: Payer: Medicare Other | Attending: Internal Medicine | Admitting: Internal Medicine

## 2023-03-06 ENCOUNTER — Encounter: Payer: Self-pay | Admitting: Internal Medicine

## 2023-03-06 VITALS — BP 106/67 | HR 56 | Resp 14 | Ht 66.0 in | Wt 147.0 lb

## 2023-03-06 DIAGNOSIS — G8929 Other chronic pain: Secondary | ICD-10-CM | POA: Diagnosis present

## 2023-03-06 DIAGNOSIS — M25562 Pain in left knee: Secondary | ICD-10-CM | POA: Insufficient documentation

## 2023-03-06 DIAGNOSIS — M069 Rheumatoid arthritis, unspecified: Secondary | ICD-10-CM | POA: Insufficient documentation

## 2023-03-06 DIAGNOSIS — Z79899 Other long term (current) drug therapy: Secondary | ICD-10-CM | POA: Insufficient documentation

## 2023-03-06 DIAGNOSIS — J849 Interstitial pulmonary disease, unspecified: Secondary | ICD-10-CM | POA: Diagnosis present

## 2023-03-06 DIAGNOSIS — M25561 Pain in right knee: Secondary | ICD-10-CM | POA: Insufficient documentation

## 2023-03-06 DIAGNOSIS — M722 Plantar fascial fibromatosis: Secondary | ICD-10-CM | POA: Diagnosis present

## 2023-03-06 DIAGNOSIS — Z7952 Long term (current) use of systemic steroids: Secondary | ICD-10-CM | POA: Insufficient documentation

## 2023-03-07 ENCOUNTER — Encounter: Payer: Self-pay | Admitting: *Deleted

## 2023-03-07 LAB — CBC WITH DIFFERENTIAL/PLATELET
Absolute Monocytes: 678 {cells}/uL (ref 200–950)
Basophils Absolute: 38 {cells}/uL (ref 0–200)
Basophils Relative: 0.6 %
Eosinophils Absolute: 346 {cells}/uL (ref 15–500)
Eosinophils Relative: 5.4 %
HCT: 39.6 % (ref 35.0–45.0)
Hemoglobin: 12.9 g/dL (ref 11.7–15.5)
Lymphs Abs: 1120 {cells}/uL (ref 850–3900)
MCH: 31.5 pg (ref 27.0–33.0)
MCHC: 32.6 g/dL (ref 32.0–36.0)
MCV: 96.8 fL (ref 80.0–100.0)
MPV: 11.8 fL (ref 7.5–12.5)
Monocytes Relative: 10.6 %
Neutro Abs: 4218 {cells}/uL (ref 1500–7800)
Neutrophils Relative %: 65.9 %
Platelets: 235 10*3/uL (ref 140–400)
RBC: 4.09 10*6/uL (ref 3.80–5.10)
RDW: 12.2 % (ref 11.0–15.0)
Total Lymphocyte: 17.5 %
WBC: 6.4 10*3/uL (ref 3.8–10.8)

## 2023-03-07 LAB — COMPLETE METABOLIC PANEL WITH GFR
AG Ratio: 1.4 (calc) (ref 1.0–2.5)
ALT: 22 U/L (ref 6–29)
AST: 25 U/L (ref 10–35)
Albumin: 4 g/dL (ref 3.6–5.1)
Alkaline phosphatase (APISO): 65 U/L (ref 37–153)
BUN: 9 mg/dL (ref 7–25)
CO2: 30 mmol/L (ref 20–32)
Calcium: 8.9 mg/dL (ref 8.6–10.4)
Chloride: 103 mmol/L (ref 98–110)
Creat: 0.74 mg/dL (ref 0.50–1.03)
Globulin: 2.9 g/dL (ref 1.9–3.7)
Glucose, Bld: 78 mg/dL (ref 65–99)
Potassium: 3.5 mmol/L (ref 3.5–5.3)
Sodium: 142 mmol/L (ref 135–146)
Total Bilirubin: 1 mg/dL (ref 0.2–1.2)
Total Protein: 6.9 g/dL (ref 6.1–8.1)
eGFR: 94 mL/min/{1.73_m2} (ref >=60)

## 2023-03-07 LAB — CK: Total CK: 320 U/L — ABNORMAL HIGH (ref 29–143)

## 2023-03-07 LAB — SEDIMENTATION RATE: Sed Rate: 6 mm/h (ref 0–30)

## 2023-03-07 NOTE — Progress Notes (Signed)
CK level partially improved at 320 down from 431 last time.  Sedimentation rate remains normal at 6.  Blood count and metabolic panel are normal so no problem for continuing the azathioprine.

## 2023-04-23 ENCOUNTER — Ambulatory Visit (HOSPITAL_BASED_OUTPATIENT_CLINIC_OR_DEPARTMENT_OTHER): Payer: Medicare HMO | Admitting: Pulmonary Disease

## 2023-04-23 ENCOUNTER — Encounter (HOSPITAL_BASED_OUTPATIENT_CLINIC_OR_DEPARTMENT_OTHER): Payer: Self-pay | Admitting: Pulmonary Disease

## 2023-04-23 VITALS — BP 102/60 | HR 68 | Resp 16 | Ht 66.0 in | Wt 144.7 lb

## 2023-04-23 DIAGNOSIS — J849 Interstitial pulmonary disease, unspecified: Secondary | ICD-10-CM

## 2023-04-23 NOTE — Patient Instructions (Addendum)
X HRCT chest before follow-up in 6 months  Your lung disease appears stable

## 2023-04-23 NOTE — Assessment & Plan Note (Addendum)
Appears stable by history and PFTs. We will reassess with high-resolution CT chest in 6 months Vaccinations are up-to-date

## 2023-04-23 NOTE — Progress Notes (Signed)
Subjective:    Patient ID: Tina Gray, female    DOB: 1965-12-16, 57 y.o.   MRN: [id number]  HPI  57 yo deaf, never smoker for FU of of CT -ILD. 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  PCP - [facility]    she was discharged from Dr. Nickola Major' practice for missed appointments.   Lisinopril changed to ARB due to persistent cough She was started on azathioprine due to hepatitis, also maintained on 5 mg prednisone  Chief Complaint  Patient presents with   Follow-up    ILD--she tries to go without inhaler she doesn't like how she feels. Needs help explaining the breathing test she took.    Interview performed using interpreter She reports that shortness of breath is at her baseline.  She did not like using albuterol MDI. She denies cough or wheezing We reviewed PFTs She is maintained on 150 mg of Imuran and 5 mg of prednisone. Reviewed rheumatology consultation  Significant tests/ events reviewed   PFTs 07/2022 moderate restriction, ratio normal, FEV1 66%, FVC 60%, TLC 76%, DLCO could not be performed  PFTs 10/2021 shows mild to moderate restriction, ratio 85, FEV1 75%, FVC 70%, no bronchodilator response, TLC 73%, DLCO maneuver could not be completed HRCT in Feb 2023 > progression , probable UIP pattern     HRCT 07/2022 >> NSIP pattern, RUL nodule unchanged, probably benign   01/2022 Ct chest wo con >> Stable right apical pulmonary nodule , 9x 5 mm compared to 10 x 7 mm in Feb  CT chest / abd/pelvis w con 09/2020 >> Bilateral lower lung groundglass, reticulation, and traction bronchiectasis are present , no honeycombing , 'probable UIP'   HRCT 07/2021 - UIP pattern, progressive compared to 2017, 10 x 7 mm right upper lobe nodule CT chest w con 04/2016 >> Hazy diffuse ground-glass opacities in both lower lobes ? viral pneumonia, idiopathic  interstitial pneumonia, sequela of alveolar edema or possibly hypersensitivity pneumonitis   Esophageal manometry 2017  Review of Systems neg for any significant sore throat, dysphagia, itching, sneezing, nasal congestion or excess/ purulent secretions, fever, chills, sweats, unintended wt loss, pleuritic or exertional cp, hempoptysis, orthopnea pnd or change in chronic leg swelling. Also denies presyncope, palpitations, heartburn, abdominal pain, nausea, vomiting, diarrhea or change in bowel or urinary habits, dysuria,hematuria, rash, arthralgias, visual complaints, headache, numbness weakness or ataxia.     Objective:   Physical Exam  Gen. Pleasant, well-nourished, in no distress ENT - no thrush, no pallor/icterus,no post nasal drip Neck: No JVD, no thyromegaly, no carotid bruits Lungs: no use of accessory muscles, no dullness to percussion, clear without rales or rhonchi  Cardiovascular: Rhythm regular, heart sounds  normal, no murmurs or gallops, no peripheral edema Musculoskeletal: No deformities, no cyanosis or clubbing        Assessment & Plan:

## 2023-05-27 ENCOUNTER — Other Ambulatory Visit: Payer: Self-pay | Admitting: Internal Medicine

## 2023-05-27 DIAGNOSIS — J849 Interstitial pulmonary disease, unspecified: Secondary | ICD-10-CM

## 2023-05-27 DIAGNOSIS — M069 Rheumatoid arthritis, unspecified: Secondary | ICD-10-CM

## 2023-05-27 NOTE — Progress Notes (Signed)
Office Visit Note  Patient: Tina Gray             Date of Birth: [birth year]           MRN: [id number]             PCP: Estevan Oaks, NP Referring: Estevan Oaks, NP Visit Date: 06/05/2023   Subjective:  Pain of the Left Shoulder and Follow-up (Right 3rd digit pain and nodule, right 5th digit numbness and pain, right thigh pain - had previous injections)   Discussed the use of AI scribe software for clinical note transcription with the patient, who gave verbal consent to proceed.  History of Present Illness   Tina Gray is a 57 y.o. 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  She has a few painful areas of complaint today.  Has a new nodule on the top side of her right middle finger.  The pain is exacerbated by contact and occasionally radiates to the ring finger and down to the wrist.  She does not recall any injury or change in activity associated. The patient also reports a deep, internal pain in their thigh, which becomes particularly severe during periods of standing or walking. The patient has a history of receiving injections for arthritis in the thigh in 2019, and wonders if this could be related to the current pain. The patient denies any swelling or bumps in the leg, and reports that the knee is generally fine, although it occasionally swells.  The patient also reports pain in their shoulder, which they describe as a sharp, jabbing sensation, particularly when lifting or turning their arm in certain ways. The pain sometimes radiates down the arm. The patient's work involves lifting boxes, which may exacerbate the shoulder pain. The patient has a history of receiving injections for knee pain, which they report provided some relief.   Encounter was conducted with assistance of in person sign language interpreter.  Previous  HPI 03/06/2023 Tina Gray is a 57 y.o. 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  Last CK level was slightly more elevated over 400 but without definite corresponding symptoms.  Currently has some increased pain in both knees.  Tenderness is mostly towards the medial side of the joint.  Especially with certain twisting movements.  Not seeing any visible swelling.  She broke her right fifth toe forcibly opening a door onto it evaluated by podiatry this has improved with no particular intervention.  Also has pain on the bottoms of both heels this is most intense first thing in the morning and worse on left foot.  No cough or shortness of breath.   Encounter was conducted with the assistance of in person sign language interpreter.   Previous HPI 12/02/2022 Tina Gray is a 57 y.o. 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 Since our last visit she broke her right 5th toe by shutting a door on it and had this reduced externally. Has some increased pain in the shoulders and in right knee. Not noticing visible swelling. Shoulder pain is worst  when trying to pull or lift heavy weight. Knee pain feels worst at the back of the knee. She has had similar pain in the past for which she had steroid injections years ago, but had been doing well off any specific treatment.   Previous HPI 08/28/22 Tina Gray is a 57 y.o. female here for follow up for seropositive RA with overlap associated ILD and persistent elevated CK on Imuran 150 mg daily and prednisone 5 mg daily.  Azathioprine dose was increased after last visit due to some ongoing joint pains and abnormal lab results.  She is not noticed any problems since increasing the medication.   Still has right shoulder pain but less severe compared to at our visit in December.  No peripheral joint swelling.  Small amounts of skin rash intermittently but not causing a lot of symptoms.  She had recent follow-up with Dr. Vassie Loll in pulmonology clinic findings are stable.  Not experiencing much shortness of breath does have productive cough with sputum early each morning.     Previous HPI 05/29/22 Tina Gray is a 57 y.o. female here for follow up for seropositive RA with associated ILD on prednisone imuran 100 mg daily and prednisone 5 mg daily.  Since decreasing the prednisone from 10 mg to 5 mg daily she has had increased soreness particularly at her bilateral shoulders.  This bothers her mostly throughout the day and a lot of time when trying to lift up with her arms or reach overhead.  She is also had some increase in rash on her elbows and forearms.  She states this has been previously attributed to psoriasis rash.  She uses a moisturizing lotion no other topical treatments.  No rash breaking out elsewhere on her body.  She is tolerating the azathioprine 100 mg without any noticeable side effect but has not seen an improvement in pain or fatigue or rash since increasing this.     Previous HPI 03/20/22 Tina Gray is a 57 y.o. female here for follow up for rheumatoid arthritis with associated ILD currently on prednisone 5 mg daily and azathioprine 50 mg daily.  We decreased the steroid dose from 10 mg after the last visit as symptoms appear well controlled and aiming to minimize side effect risks.  She has noticed some increased pain at multiple areas especially in both upper extremities and at the top of her neck since reducing the prednisone.  She is not typically seeing a lot of swelling but more pain or sensitivity to pressure.  The pain in her neck is right near the base of the skull describes tingling or burning in character and radiates along the back of her neck or back of her scalp  periodically.  She particularly feels the worse symptoms when extending her neck.  Follow-up with pulmonology clinic Dr. Vassie Loll review of most recent CT scan looks stable.  Lab work checked at last visit was negative for RA serology.  She did have a moderately elevated CK level 1178.  Liver function test have returned to a normal baseline.  Sedimentation rate and CRP were also normal.  She did have a positive ANA 1:80 titer.     Previous HPI 02/20/2022 Tina Gray is a 57 y.o. female originally from [place] here for rheumatoid arthritis previously seen with Dr. Nickola Major. 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  She has some chronic cough associated but does not  get particularly short of breath at baseline or significantly limiting activities.  This ILD has been attributed most likely suspected due to underlying RA but also apparently question of possible myositis in history as well. Original onset of joint pains and swelling at multiple areas including hands hips feet originally evaluated for rheumatology in 2018.  She had treatment for bilateral carpal tunnel syndrome with good improvement of hand symptoms.  However since then has had persistent joint pains pretty much all the time if she is not on a sufficient dose of prednisone.  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  Currently azathioprine 50 mg daily and prednisone 10 mg daily.  We will have some ongoing pain at her hip and feet but elsewhere doing fine and the symptoms are manageable while on the current steroid dose. She had gastroenterology evaluations as well in 2018 with abnormal LFTs but liver biopsy unremarkable for active inflammatory disease process.  Also significant work-up for dysphagia with manometry nonspecific for a cause.   Hip xray 01/30/22 Mild  OA   Review of Systems  Constitutional:  Negative for fatigue.  HENT:  Positive for mouth dryness. Negative for mouth sores.   Eyes:  Positive for dryness.  Respiratory:  Negative for shortness of breath.   Cardiovascular:  Negative for chest pain and palpitations.  Gastrointestinal:  Negative for blood in stool, constipation and diarrhea.  Endocrine: Negative for increased urination.  Genitourinary:  Positive for involuntary urination.  Musculoskeletal:  Positive for joint pain, joint pain and muscle tenderness. Negative for gait problem, joint swelling, myalgias, muscle weakness, morning stiffness and myalgias.  Skin:  Positive for nodules/bumps. Negative for color change, rash, hair loss and sensitivity to sunlight.  Allergic/Immunologic: Negative for susceptible to infections.  Neurological:  Negative for dizziness and headaches.  Hematological:  Negative for swollen glands.  Psychiatric/Behavioral:  Positive for sleep disturbance. Negative for depressed mood. The patient is not nervous/anxious.     PMFS History:  Patient Active Problem List   Diagnosis Date Noted   Bilateral knee pain 03/06/2023   High risk medication use 02/20/2022   Unilateral primary osteoarthritis, left hip 02/20/2022   Bilateral plantar fasciitis 02/20/2022   Pulmonary nodule, left 02/20/2022   Chronic cough 08/10/2021   Rheumatoid arthritis (HCC) 08/10/2021   ILD (interstitial lung disease) (HCC) 11/28/2020   Dysphagia    Chest pain at rest 12/02/2015   HTN (hypertension) 12/02/2015   Hypokalemia 12/02/2015   Elevated troponin 12/02/2015   Chest pain 12/02/2015   Right arm pain 12/02/2015   Abnormal uterine bleeding 03/17/2013   Anemia, iron deficiency 03/05/2013    Past Medical History:  Diagnosis Date   Carpal tunnel syndrome    Chest pain    Deaf    GERD (gastroesophageal reflux disease)    Hypertension    Rheumatoid arthritis (HCC)     Family History  Problem Relation Age of Onset    Heart attack Mother    Heart disease Mother    Cancer Father    Esophageal cancer Father    Prostate cancer Brother    Healthy Son    Healthy Son    Colon cancer Neg Hx    Rectal cancer Neg Hx    Stomach cancer Neg Hx    Past Surgical History:  Procedure Laterality Date   ESOPHAGEAL MANOMETRY N/A 03/13/2016   Procedure: ESOPHAGEAL MANOMETRY (EM);  Surgeon: Starr Lake  Dorna Leitz, MD;  Location: [facility];  Service: Gastroenterology;  Laterality: N/A;   LAPAROSCOPIC OVARIAN CYSTECTOMY     Social History   Social History Narrative   Not on file   Immunization History  Administered Date(s) Administered   Influenza,inj,Quad PF,6+ Mos 03/15/2013   Influenza-Unspecified 04/23/2023   PNEUMOCOCCAL CONJUGATE-20 02/20/2022     Objective: Vital Signs: BP 99/62 (BP Location: Left Arm, Patient Position: Sitting, Cuff Size: Normal)   Pulse (!) 58   Ht 5\' 6"  (1.676 m)   Wt 142 lb (64.4 kg)   LMP 06/10/2014   BMI 22.92 kg/m    Physical Exam Eyes:     Conjunctiva/sclera: Conjunctivae normal.  Cardiovascular:     Rate and Rhythm: Normal rate and regular rhythm.  Pulmonary:     Effort: Pulmonary effort is normal.     Breath sounds: Normal breath sounds.  Lymphadenopathy:     Cervical: No cervical adenopathy.  Skin:    General: Skin is warm and dry.  Neurological:     Mental Status: She is alert.  Psychiatric:        Mood and Affect: Mood normal.      Musculoskeletal Exam:  Shoulder tenderness to pressure laterally, pain with resisted abduction but none with passive full ROM Elbows full ROM no tenderness or swelling Wrists full ROM no tenderness or swelling Fingers full ROM, minimally tender nodule on dorsal side of third PIP Knees full ROM no tenderness or swelling Ankles full ROM no tenderness or swelling   Investigation: No additional findings.  Imaging: XR FEMUR, MIN 2 VIEWS RIGHT Result Date: 06/05/2023 X-ray right femur 4 views No visible bony abnormality at  femur diaphysis or metaphysis.  Knee joint appears to be very mild osteoarthritis and with superior patellar enthesophytes.  Right hip joint appears normal.  No visible soft tissue swelling or abnormal calcifications seen. Impression There is consistent with mild osteoarthritis no acute appearing bony abnormality   Recent Labs: Lab Results  Component Value Date   WBC 6.4 03/06/2023   HGB 12.9 03/06/2023   PLT 235 03/06/2023   NA 142 03/06/2023   K 3.5 03/06/2023   CL 103 03/06/2023   CO2 30 03/06/2023   GLUCOSE 78 03/06/2023   BUN 9 03/06/2023   CREATININE 0.74 03/06/2023   BILITOT 1.0 03/06/2023   ALKPHOS 53 02/20/2022   AST 25 03/06/2023   ALT 22 03/06/2023   PROT 6.9 03/06/2023   ALBUMIN 4.0 02/20/2022   CALCIUM 8.9 03/06/2023   GFRAA >60 04/14/2017   QFTBGOLDPLUS NEGATIVE 02/20/2022    Speciality Comments: No specialty comments available.  Procedures:  No procedures performed Allergies: Patient has no known allergies.   Assessment / Plan:     Visit Diagnoses: Rheumatoid arthritis, involving unspecified site, unspecified whether rheumatoid factor present (HCC)  No peripheral joint synovitis appreciable.  Checking the sed rate and CK again for disease activity monitoring plan to continue on azathioprine 150 mg daily prednisone 5 mg daily.  Digital Mucous Cyst Painful nodule on the fifth digit of the hand, likely related to underlying osteoarthritis. No joint pain. -Observe for now given small size and intermittent pain. -Provide patient with information about digital mucous cysts. -If increasing in size or more symptomatic in follow-up could try aspiration or referral to hand surgery  Thigh Pain Deep, intermittent pain in the thigh, possibly related to previous injections for arthritis in 2019. No recent trauma, swelling, or bumps. -Order CK level to assess for muscle inflammation or  damage. -Order X-ray of the femur to rule out any bone abnormalities.  Rotator Cuff  Strain Pain in the shoulder, exacerbated by certain movements and heavy lifting at work. No radiating pain. -Provide patient with a set of home exercises for rotator cuff strain. -Consider referral to physical therapy if no improvement with home exercises. -Consider corticosteroid injection in the future if pain persists or worsens.     Orders: Orders Placed This Encounter  Procedures   XR FEMUR, MIN 2 VIEWS RIGHT   Sedimentation rate   CK   COMPLETE METABOLIC PANEL WITH GFR   CBC with Differential/Platelet   No orders of the defined types were placed in this encounter.    Follow-Up Instructions: Return in about 3 months (around 09/03/2023) for RA/ILD on AZA/GC f/u 3mos.   Fuller Plan, MD  Note - This record has been created using [employer].  Chart creation errors have been sought, but may not always  have been located. Such creation errors do not reflect on  the standard of medical care.

## 2023-05-27 NOTE — Telephone Encounter (Signed)
Last Fill: 02/20/2023  Labs: 03/06/2023 CK level partially improved at 320 down from 431 last time.  Sedimentation rate remains normal at 6.  Blood count and metabolic panel are normal so no problem for continuing the azathioprine.   Next Visit: 06/05/2023  Last Visit: 03/06/2023  DX:  Rheumatoid arthritis, involving unspecified site, unspecified whether rheumatoid factor present    Current Dose per office note 03/06/2023: azathioprine 150 mg daily   Okay to refill Imuran?

## 2023-06-05 ENCOUNTER — Ambulatory Visit: Payer: BLUE CROSS/BLUE SHIELD

## 2023-06-05 ENCOUNTER — Ambulatory Visit: Payer: BLUE CROSS/BLUE SHIELD | Attending: Internal Medicine | Admitting: Internal Medicine

## 2023-06-05 ENCOUNTER — Encounter: Payer: Self-pay | Admitting: Internal Medicine

## 2023-06-05 VITALS — BP 99/62 | HR 58 | Ht 66.0 in | Wt 142.0 lb

## 2023-06-05 DIAGNOSIS — M069 Rheumatoid arthritis, unspecified: Secondary | ICD-10-CM

## 2023-06-05 DIAGNOSIS — M79604 Pain in right leg: Secondary | ICD-10-CM

## 2023-06-05 DIAGNOSIS — Z7952 Long term (current) use of systemic steroids: Secondary | ICD-10-CM

## 2023-06-05 DIAGNOSIS — Z79899 Other long term (current) drug therapy: Secondary | ICD-10-CM | POA: Diagnosis not present

## 2023-06-05 DIAGNOSIS — J849 Interstitial pulmonary disease, unspecified: Secondary | ICD-10-CM

## 2023-06-05 DIAGNOSIS — M25562 Pain in left knee: Secondary | ICD-10-CM

## 2023-06-05 DIAGNOSIS — M25561 Pain in right knee: Secondary | ICD-10-CM

## 2023-06-05 DIAGNOSIS — G8929 Other chronic pain: Secondary | ICD-10-CM

## 2023-06-05 DIAGNOSIS — M722 Plantar fascial fibromatosis: Secondary | ICD-10-CM

## 2023-06-06 LAB — CBC WITH DIFFERENTIAL/PLATELET
Absolute Lymphocytes: 1044 {cells}/uL (ref 850–3900)
Absolute Monocytes: 803 {cells}/uL (ref 200–950)
Basophils Absolute: 51 {cells}/uL (ref 0–200)
Basophils Relative: 0.7 %
Eosinophils Absolute: 402 {cells}/uL (ref 15–500)
Eosinophils Relative: 5.5 %
HCT: 42.4 % (ref 35.0–45.0)
Hemoglobin: 13.9 g/dL (ref 11.7–15.5)
MCH: 32.7 pg (ref 27.0–33.0)
MCHC: 32.8 g/dL (ref 32.0–36.0)
MCV: 99.8 fL (ref 80.0–100.0)
MPV: 12 fL (ref 7.5–12.5)
Monocytes Relative: 11 %
Neutro Abs: 5001 {cells}/uL (ref 1500–7800)
Neutrophils Relative %: 68.5 %
Platelets: 252 10*3/uL (ref 140–400)
RBC: 4.25 10*6/uL (ref 3.80–5.10)
RDW: 11.9 % (ref 11.0–15.0)
Total Lymphocyte: 14.3 %
WBC: 7.3 10*3/uL (ref 3.8–10.8)

## 2023-06-06 LAB — COMPLETE METABOLIC PANEL WITH GFR
AG Ratio: 1.4 (calc) (ref 1.0–2.5)
ALT: 34 U/L — ABNORMAL HIGH (ref 6–29)
AST: 28 U/L (ref 10–35)
Albumin: 4.2 g/dL (ref 3.6–5.1)
Alkaline phosphatase (APISO): 68 U/L (ref 37–153)
BUN: 11 mg/dL (ref 7–25)
CO2: 33 mmol/L — ABNORMAL HIGH (ref 20–32)
Calcium: 9.5 mg/dL (ref 8.6–10.4)
Chloride: 103 mmol/L (ref 98–110)
Creat: 0.8 mg/dL (ref 0.50–1.03)
Globulin: 3 g/dL (ref 1.9–3.7)
Glucose, Bld: 89 mg/dL (ref 65–99)
Potassium: 3.5 mmol/L (ref 3.5–5.3)
Sodium: 143 mmol/L (ref 135–146)
Total Bilirubin: 1 mg/dL (ref 0.2–1.2)
Total Protein: 7.2 g/dL (ref 6.1–8.1)
eGFR: 85 mL/min/{1.73_m2} (ref >=60)

## 2023-06-06 LAB — CK: Total CK: 260 U/L — ABNORMAL HIGH (ref 29–143)

## 2023-06-06 LAB — SEDIMENTATION RATE: Sed Rate: 9 mm/h (ref 0–30)

## 2023-06-12 ENCOUNTER — Other Ambulatory Visit: Payer: Self-pay | Admitting: Nurse Practitioner

## 2023-06-12 ENCOUNTER — Ambulatory Visit
Admission: RE | Admit: 2023-06-12 | Discharge: 2023-06-12 | Disposition: A | Discharge status: Home / Self Care | Payer: Medicare HMO | Source: Ambulatory Visit | Attending: Nurse Practitioner | Admitting: Nurse Practitioner

## 2023-06-12 DIAGNOSIS — J440 Chronic obstructive pulmonary disease with acute lower respiratory infection: Secondary | ICD-10-CM

## 2023-08-30 ENCOUNTER — Other Ambulatory Visit: Payer: Self-pay | Admitting: Internal Medicine

## 2023-08-30 DIAGNOSIS — J849 Interstitial pulmonary disease, unspecified: Secondary | ICD-10-CM

## 2023-08-30 DIAGNOSIS — M069 Rheumatoid arthritis, unspecified: Secondary | ICD-10-CM

## 2023-09-01 NOTE — Telephone Encounter (Signed)
 Last Fill: 05/30/2023  Labs: 06/05/2023 CO2 33, ALT 34,   Next Visit: 09/03/2023  Last Visit: 06/05/2023  DX: Rheumatoid arthritis, involving unspecified site, unspecified whether rheumatoid factor present   Current Dose per office note 06/05/2023: Imuran 150 mg daily   Okay to refill Imuran?

## 2023-09-02 NOTE — Progress Notes (Signed)
 Office Visit Note  Patient: Tina Gray             Date of Birth: 20-Jan-1966           MRN: [id number]             PCP: Estevan Oaks, NP Referring: Estevan Oaks, NP Visit Date: 09/03/2023   Subjective:  Follow-up (Patient states she has pain in her left shoulder. Patient also inquires when she should stop the prednisone. )   History of Present Illness: Tina Gray is a 58 y.o. 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 Overall symptoms have been under control without any worsening in cough, shortness of breath, and no weakness or joint swelling. She does have some increase in shoulder pain on the left side. This hurts with lying on the side, lifting anything heavy, and overhead movement. Not radiating in the arm. Symptoms improve partially with her medications and at rest.  She has been sick with URI symptoms shared with her grandchildren but improved without additional medication.  Encounter was conducted with assistance of in person sign language interpreter.    Previous HPI 06/05/23 Tina Gray is a 58 y.o. 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  She has a few painful areas of complaint today.  Has a new nodule on the top side of her right middle finger.  The pain is exacerbated by contact and occasionally radiates to the ring finger and down to the wrist.  She does not recall any injury or change in activity associated. The patient also reports a deep, internal pain in their thigh, which becomes particularly severe during periods of standing or walking. The patient has a history of receiving injections for arthritis in the thigh in 2019, and wonders if this could be related to the current pain. The patient denies any swelling or bumps in the leg,  and reports that the knee is generally fine, although it occasionally swells.   The patient also reports pain in their shoulder, which they describe as a sharp, jabbing sensation, particularly when lifting or turning their arm in certain ways. The pain sometimes radiates down the arm. The patient's work involves lifting boxes, which may exacerbate the shoulder pain. The patient has a history of receiving injections for knee pain, which they report provided some relief.    Encounter was conducted with assistance of in person sign language interpreter.   Previous HPI 03/06/2023 Tina Gray is a 58 y.o. 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  Last CK level was slightly more elevated over 400 but without definite corresponding symptoms.  Currently has some increased pain in both knees.  Tenderness is mostly towards the medial side of the joint.  Especially with certain twisting movements.  Not seeing any visible swelling.  She broke her right fifth toe forcibly opening a door onto it evaluated by podiatry this has improved with no particular intervention.  Also has pain on the bottoms of both heels this is most intense first thing in the morning and worse on left foot.  No cough or shortness of  breath.   Encounter was conducted with the assistance of in person sign language interpreter.   Previous HPI 12/02/2022 Tina Gray is a 58 y.o. 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 Since our last visit she broke her right 5th toe by shutting a door on it and had this reduced externally. Has some increased pain in the shoulders and in right knee. Not noticing visible swelling. Shoulder pain is worst when trying to pull or lift  heavy weight. Knee pain feels worst at the back of the knee. She has had similar pain in the past for which she had steroid injections years ago, but had been doing well off any specific treatment.   Previous HPI 08/28/22 Tina Gray is a 58 y.o. female here for follow up for seropositive RA with overlap associated ILD and persistent elevated CK on Imuran 150 mg daily and prednisone 5 mg daily.  Azathioprine dose was increased after last visit due to some ongoing joint pains and abnormal lab results.  She is not noticed any problems since increasing the medication.  Still has right shoulder pain but less severe compared to at our visit in December.  No peripheral joint swelling.  Small amounts of skin rash intermittently but not causing a lot of symptoms.  She had recent follow-up with Dr. Vassie Loll in pulmonology clinic findings are stable.  Not experiencing much shortness of breath does have productive cough with sputum early each morning.     Previous HPI 05/29/22 Tina Gray is a 58 y.o. female here for follow up for seropositive RA with associated ILD on prednisone imuran 100 mg daily and prednisone 5 mg daily.  Since decreasing the prednisone from 10 mg to 5 mg daily she has had increased soreness particularly at her bilateral shoulders.  This bothers her mostly throughout the day and a lot of time when trying to lift up with her arms or reach overhead.  She is also had some increase in rash on her elbows and forearms.  She states this has been previously attributed to psoriasis rash.  She uses a moisturizing lotion no other topical treatments.  No rash breaking out elsewhere on her body.  She is tolerating the azathioprine 100 mg without any noticeable side effect but has not seen an improvement in pain or fatigue or rash since increasing this.     Previous HPI 03/20/22 Tina Gray is a 58 y.o. female here for follow up for rheumatoid arthritis with associated ILD currently on prednisone 5 mg daily  and azathioprine 50 mg daily.  We decreased the steroid dose from 10 mg after the last visit as symptoms appear well controlled and aiming to minimize side effect risks.  She has noticed some increased pain at multiple areas especially in both upper extremities and at the top of her neck since reducing the prednisone.  She is not typically seeing a lot of swelling but more pain or sensitivity to pressure.  The pain in her neck is right near the base of the skull describes tingling or burning in character and radiates along the back of her neck or back of her scalp periodically.  She particularly feels the worse symptoms when extending her neck.  Follow-up with pulmonology clinic Dr. Vassie Loll review of most recent CT scan looks stable.  Lab work checked at last visit  was negative for RA serology.  She did have a moderately elevated CK level 1178.  Liver function test have returned to a normal baseline.  Sedimentation rate and CRP were also normal.  She did have a positive ANA 1:80 titer.     Previous HPI 02/20/2022 ASHYRA CANTIN is a 58 y.o. female originally from [place] here for rheumatoid arthritis previously seen with Dr. Nickola Major. 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  She has some chronic cough associated but does not get particularly short of breath at baseline or significantly limiting activities.  This ILD has been attributed most likely suspected due to underlying RA but also apparently question of possible myositis in history as well. Original onset of joint pains and swelling at multiple areas including hands hips feet originally evaluated for rheumatology in 2018.  She had treatment for bilateral carpal tunnel syndrome with good improvement of hand symptoms.  However since then has had persistent joint pains pretty much all the time if she is not on a sufficient dose of prednisone.  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  Currently azathioprine 50 mg daily and prednisone 10 mg daily.  We will have some ongoing pain at her hip and feet but elsewhere doing fine and the symptoms are manageable while on the current steroid dose. She had gastroenterology evaluations as well in 2018 with abnormal LFTs but liver biopsy unremarkable for active inflammatory disease process.  Also significant work-up for dysphagia with manometry nonspecific for a cause.   Hip xray 01/30/22 Mild OA   Review of Systems  Constitutional:  Positive for fatigue.  HENT:  Positive for mouth dryness. Negative for mouth sores.   Eyes:  Positive for dryness.  Respiratory:  Negative for shortness of breath.   Cardiovascular:  Positive for palpitations. Negative for chest pain.  Gastrointestinal:  Negative for blood in stool, constipation and diarrhea.  Endocrine: Negative for increased urination.  Genitourinary:  Positive for involuntary urination.  Musculoskeletal:  Positive for joint swelling, myalgias, muscle tenderness and myalgias. Negative for joint pain, gait problem, joint pain, muscle weakness and morning stiffness.  Skin:  Negative for color change, rash, hair loss and sensitivity to sunlight.  Allergic/Immunologic: Negative for susceptible to infections.  Neurological:  Positive for dizziness. Negative for headaches.  Hematological:  Negative for swollen glands.  Psychiatric/Behavioral:  Negative for depressed mood and sleep disturbance. The patient is not nervous/anxious.     PMFS History:  Patient Active Problem List   Diagnosis Date Noted   Bilateral knee pain 03/06/2023   High risk medication use 02/20/2022   Unilateral primary osteoarthritis, left hip 02/20/2022   Bilateral plantar fasciitis 02/20/2022   Pulmonary nodule, left 02/20/2022   Chronic cough 08/10/2021   Rheumatoid arthritis (HCC) 08/10/2021   ILD  (interstitial lung disease) (HCC) 11/28/2020   Dysphagia    Chest pain at rest 12/02/2015   HTN (hypertension) 12/02/2015   Hypokalemia 12/02/2015   Elevated troponin 12/02/2015   Chest pain 12/02/2015   Right arm pain 12/02/2015   Abnormal uterine bleeding 03/17/2013   Anemia, iron deficiency 03/05/2013    Past Medical History:  Diagnosis Date   Carpal tunnel syndrome    Chest pain    Deaf    GERD (gastroesophageal reflux disease)    Hypertension    Rheumatoid arthritis (HCC)  Family History  Problem Relation Age of Onset   Heart attack Mother    Heart disease Mother    Cancer Father    Esophageal cancer Father    Prostate cancer Brother    Healthy Son    Healthy Son    Colon cancer Neg Hx    Rectal cancer Neg Hx    Stomach cancer Neg Hx    Past Surgical History:  Procedure Laterality Date   ESOPHAGEAL MANOMETRY N/A 03/13/2016   Procedure: ESOPHAGEAL MANOMETRY (EM);  Surgeon: Sherrilyn Rist, MD;  Location: [facility];  Service: Gastroenterology;  Laterality: N/A;   LAPAROSCOPIC OVARIAN CYSTECTOMY     Social History   Social History Narrative   Not on file   Immunization History  Administered Date(s) Administered   Influenza,inj,Quad PF,6+ Mos 03/15/2013   Influenza-Unspecified 04/23/2023   PNEUMOCOCCAL CONJUGATE-20 02/20/2022     Objective: Vital Signs: BP 99/62 (BP Location: Left Arm, Patient Position: Sitting, Cuff Size: Normal)   Pulse (!) 58   Resp 14   Ht 5\' 6"  (1.676 m)   Wt 140 lb (63.5 kg)   LMP 06/10/2014   BMI 22.60 kg/m    Physical Exam Eyes:     Conjunctiva/sclera: Conjunctivae normal.  Cardiovascular:     Rate and Rhythm: Normal rate and regular rhythm.  Pulmonary:     Effort: Pulmonary effort is normal.     Breath sounds: Normal breath sounds.  Musculoskeletal:     Right lower leg: No edema.     Left lower leg: No edema.  Lymphadenopathy:     Cervical: No cervical adenopathy.  Skin:    General: Skin is warm and dry.      Findings: No rash.  Neurological:     Mental Status: She is alert.  Psychiatric:        Mood and Affect: Mood normal.      Musculoskeletal Exam:  Left shoulder pain with direct pressure, painful arc test, no palpable swelling Elbows full ROM no tenderness or swelling Wrists full ROM no tenderness or swelling Fingers full ROM, no palpble swelling Knees full ROM no tenderness or swelling Ankles full ROM no tenderness or swelling    Investigation: No additional findings.  Imaging: No results found.  Recent Labs: Lab Results  Component Value Date   WBC 5.3 09/03/2023   HGB 13.3 09/03/2023   PLT 283 09/03/2023   NA 140 09/03/2023   K 4.4 09/03/2023   CL 103 09/03/2023   CO2 33 (H) 09/03/2023   GLUCOSE 89 09/03/2023   BUN 11 09/03/2023   CREATININE 0.72 09/03/2023   BILITOT 0.8 09/03/2023   ALKPHOS 53 02/20/2022   AST 32 09/03/2023   ALT 36 (H) 09/03/2023   PROT 7.1 09/03/2023   ALBUMIN 4.0 02/20/2022   CALCIUM 9.3 09/03/2023   GFRAA >60 04/14/2017   QFTBGOLDPLUS NEGATIVE 02/20/2022    Speciality Comments: No specialty comments available.  Procedures:  No procedures performed Allergies: Patient has no known allergies.   Assessment / Plan:     Visit Diagnoses: Rheumatoid arthritis, involving unspecified site, unspecified whether rheumatoid factor present (HCC) - Plan: predniSONE (DELTASONE) 5 MG tablet, CK, Sedimentation rate Symptoms appear well controlled. Shoulder pain more consistent with rotator cuff arthropathy possible overuse component with repeated lifting for work. No peripheral joint synovitis appreciable. -Checking the sed rate and CK again for disease activity monitoring -If labs stable consider AZA tapering down to 100 mg daily vs continue 150 mg -Prednisone 5  mg daily  ILD (interstitial lung disease) (HCC) - Plan: predniSONE (DELTASONE) 5 MG tablet, CK, Sedimentation rate NSIP pattern, clinical picture more arthritis symptomatic but maybe overlap  with myositis by serology and associated ILD. Symptoms stable, monitoring and treatment plan as above. She has next f/u scheduled May. Appears stable by prior imaging and PFTs last year.  High risk medication use - Plan: CBC with Differential/Platelet, COMPLETE METABOLIC PANEL WITH GFR Checking CBC and CMP for medication monitoring on long term azathioprine. One interval infection, uncomplicated URI probably unrelated.  Orders: Orders Placed This Encounter  Procedures   CBC with Differential/Platelet   COMPLETE METABOLIC PANEL WITH GFR   CK   Sedimentation rate   Meds ordered this encounter  Medications   predniSONE (DELTASONE) 5 MG tablet    Sig: Take 0.5 tablets (2.5 mg total) by mouth daily with breakfast.    Dispense:  90 tablet    Refill:  0     Follow-Up Instructions: Return in about 3 months (around 12/04/2023) for RA/ILD on AZA/GC f/u 3mos.   Fuller Plan, MD  Note - This record has been created using [employer].  Chart creation errors have been sought, but may not always  have been located. Such creation errors do not reflect on  the standard of medical care.

## 2023-09-03 ENCOUNTER — Ambulatory Visit: Payer: Medicare HMO | Attending: Internal Medicine | Admitting: Internal Medicine

## 2023-09-03 ENCOUNTER — Encounter: Payer: Self-pay | Admitting: Internal Medicine

## 2023-09-03 VITALS — BP 99/62 | HR 58 | Resp 14 | Ht 66.0 in | Wt 140.0 lb

## 2023-09-03 DIAGNOSIS — J849 Interstitial pulmonary disease, unspecified: Secondary | ICD-10-CM | POA: Diagnosis not present

## 2023-09-03 DIAGNOSIS — Z79899 Other long term (current) drug therapy: Secondary | ICD-10-CM

## 2023-09-03 DIAGNOSIS — M069 Rheumatoid arthritis, unspecified: Secondary | ICD-10-CM

## 2023-09-03 MED ORDER — PREDNISONE 5 MG PO TABS: 2.5000 mg | ORAL_TABLET | Freq: Every day | ORAL | 0 refills | Status: DC

## 2023-09-04 LAB — CBC WITH DIFFERENTIAL/PLATELET
Absolute Lymphocytes: 885 {cells}/uL (ref 850–3900)
Absolute Monocytes: 371 {cells}/uL (ref 200–950)
Basophils Absolute: 42 {cells}/uL (ref 0–200)
Basophils Relative: 0.8 %
Eosinophils Absolute: 42 {cells}/uL (ref 15–500)
Eosinophils Relative: 0.8 %
HCT: 40.2 % (ref 35.0–45.0)
Hemoglobin: 13.3 g/dL (ref 11.7–15.5)
MCH: 32.2 pg (ref 27.0–33.0)
MCHC: 33.1 g/dL (ref 32.0–36.0)
MCV: 97.3 fL (ref 80.0–100.0)
MPV: 11.4 fL (ref 7.5–12.5)
Monocytes Relative: 7 %
Neutro Abs: 3959 {cells}/uL (ref 1500–7800)
Neutrophils Relative %: 74.7 %
Platelets: 283 10*3/uL (ref 140–400)
RBC: 4.13 10*6/uL (ref 3.80–5.10)
RDW: 12.6 % (ref 11.0–15.0)
Total Lymphocyte: 16.7 %
WBC: 5.3 10*3/uL (ref 3.8–10.8)

## 2023-09-04 LAB — COMPLETE METABOLIC PANEL WITH GFR
AG Ratio: 1.4 (calc) (ref 1.0–2.5)
ALT: 36 U/L — ABNORMAL HIGH (ref 6–29)
AST: 32 U/L (ref 10–35)
Albumin: 4.1 g/dL (ref 3.6–5.1)
Alkaline phosphatase (APISO): 60 U/L (ref 37–153)
BUN: 11 mg/dL (ref 7–25)
CO2: 33 mmol/L — ABNORMAL HIGH (ref 20–32)
Calcium: 9.3 mg/dL (ref 8.6–10.4)
Chloride: 103 mmol/L (ref 98–110)
Creat: 0.72 mg/dL (ref 0.50–1.03)
Globulin: 3 g/dL (ref 1.9–3.7)
Glucose, Bld: 89 mg/dL (ref 65–99)
Potassium: 4.4 mmol/L (ref 3.5–5.3)
Sodium: 140 mmol/L (ref 135–146)
Total Bilirubin: 0.8 mg/dL (ref 0.2–1.2)
Total Protein: 7.1 g/dL (ref 6.1–8.1)
eGFR: 96 mL/min/{1.73_m2} (ref >=60)

## 2023-09-04 LAB — CK: Total CK: 542 U/L — ABNORMAL HIGH (ref 21–240)

## 2023-09-04 LAB — SEDIMENTATION RATE: Sed Rate: 11 mm/h (ref 0–30)

## 2023-09-12 ENCOUNTER — Other Ambulatory Visit: Payer: Self-pay | Admitting: Nurse Practitioner

## 2023-09-12 DIAGNOSIS — Z1231 Encounter for screening mammogram for malignant neoplasm of breast: Secondary | ICD-10-CM

## 2023-10-06 ENCOUNTER — Encounter (HOSPITAL_BASED_OUTPATIENT_CLINIC_OR_DEPARTMENT_OTHER): Payer: Self-pay | Admitting: Pulmonary Disease

## 2023-10-06 ENCOUNTER — Encounter (HOSPITAL_BASED_OUTPATIENT_CLINIC_OR_DEPARTMENT_OTHER): Payer: Self-pay

## 2023-10-22 ENCOUNTER — Ambulatory Visit (HOSPITAL_BASED_OUTPATIENT_CLINIC_OR_DEPARTMENT_OTHER)
Admission: RE | Admit: 2023-10-22 | Discharge: 2023-10-22 | Disposition: A | Discharge status: Home / Self Care | Source: Ambulatory Visit | Attending: Pulmonary Disease | Admitting: Pulmonary Disease

## 2023-10-22 DIAGNOSIS — J849 Interstitial pulmonary disease, unspecified: Secondary | ICD-10-CM | POA: Insufficient documentation

## 2023-10-28 ENCOUNTER — Ambulatory Visit (HOSPITAL_BASED_OUTPATIENT_CLINIC_OR_DEPARTMENT_OTHER): Payer: Medicare HMO | Admitting: Pulmonary Disease

## 2023-10-29 ENCOUNTER — Encounter (HOSPITAL_BASED_OUTPATIENT_CLINIC_OR_DEPARTMENT_OTHER): Payer: Self-pay | Admitting: Pulmonary Disease

## 2023-10-30 ENCOUNTER — Encounter (HOSPITAL_BASED_OUTPATIENT_CLINIC_OR_DEPARTMENT_OTHER): Payer: Self-pay

## 2023-11-24 ENCOUNTER — Other Ambulatory Visit: Payer: Self-pay | Admitting: Internal Medicine

## 2023-11-24 DIAGNOSIS — M069 Rheumatoid arthritis, unspecified: Secondary | ICD-10-CM

## 2023-11-24 DIAGNOSIS — J849 Interstitial pulmonary disease, unspecified: Secondary | ICD-10-CM

## 2023-11-24 NOTE — Progress Notes (Signed)
 Office Visit Note  Patient: Tina Gray             Date of Birth: 02-02-1966           MRN: [id number]             PCP: Delores Rojelio Caldron, NP Referring: Delores Rojelio Caldron, NP Visit Date: 12/08/2023   Subjective:  Follow-up   Discussed the use of AI scribe software for clinical note transcription with the patient, who gave verbal consent to proceed.  History of Present Illness   Tina Gray is a 58 y.o. 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    Her recent chest scan showed no changes compared to the scan from 2023.  She has not experienced any persistent symptoms with cough dyspnea or chest pain.  She experiences inflammation primarily in her feet, which she attributes to walking a lot at work. The inflammation worsens throughout the day but improves by morning. Her feet are swollen daily, and she tries to elevate them when possible. Wearing compression socks helps with the swelling.  She is concerned about the impact of her medication on her blood test results, noting that her blood test numbers have not improved.   No recent illness, color changes, or circulation problems in her feet. She experiences pain in her back and hip area, which she describes as 'the same as it's always been.' She also mentions a history of plantar fasciitis due to being on her feet a lot.  She takes eight medications in total and wants to reduce her medication burden.    Previous HPI 09/03/2023 Tina Gray is a 58 y.o. 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 Overall symptoms have been under control without any worsening in cough, shortness of breath, and no weakness or joint swelling. She does have some increase in shoulder pain on the left side. This hurts with lying on the side, lifting anything heavy, and overhead movement.  Not radiating in the arm. Symptoms improve partially with her medications and at rest.   She has been sick with URI symptoms shared with her grandchildren but improved without additional medication.   Encounter was conducted with assistance of in person sign language interpreter.       Previous HPI 06/05/23 Tina Gray is a 58 y.o. 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  She has a few painful areas of complaint today.  Has a new nodule on the top side of her right middle finger.  The pain is exacerbated by contact and occasionally radiates to the ring finger and down to the wrist.  She does not recall any injury or change in activity associated. The patient also reports a deep, internal pain in their thigh, which becomes particularly severe during periods of standing or walking. The patient has a history of receiving injections for arthritis in the thigh in 2019, and wonders if this could be related to the current pain. The patient denies any swelling or bumps in the leg, and reports that the knee is generally fine, although it occasionally swells.   The patient also reports pain in their shoulder, which they describe  as a sharp, jabbing sensation, particularly when lifting or turning their arm in certain ways. The pain sometimes radiates down the arm. The patient's work involves lifting boxes, which may exacerbate the shoulder pain. The patient has a history of receiving injections for knee pain, which they report provided some relief.    Encounter was conducted with assistance of in person sign language interpreter.   Previous HPI 03/06/2023 Tina Gray is a 58 y.o. 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  Last  CK level was slightly more elevated over 400 but without definite corresponding symptoms.  Currently has some increased pain in both knees.  Tenderness is mostly towards the medial side of the joint.  Especially with certain twisting movements.  Not seeing any visible swelling.  She broke her right fifth toe forcibly opening a door onto it evaluated by podiatry this has improved with no particular intervention.  Also has pain on the bottoms of both heels this is most intense first thing in the morning and worse on left foot.  No cough or shortness of breath.   Encounter was conducted with the assistance of in person sign language interpreter.   Previous HPI 12/02/2022 Tina Gray is a 58 y.o. 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 Since our last visit she broke her right 5th toe by shutting a door on it and had this reduced externally. Has some increased pain in the shoulders and in right knee. Not noticing visible swelling. Shoulder pain is worst when trying to pull or lift heavy weight. Knee pain feels worst at the back of the knee. She has had similar pain in the past for which she had steroid injections years ago, but had been doing well off any specific treatment.   Previous HPI 08/28/22 Tina Gray is a 58 y.o. female here for follow up for seropositive RA with overlap associated ILD and persistent elevated CK on Imuran  150 mg daily and prednisone  5 mg daily.  Azathioprine  dose was increased after last visit due to some ongoing joint pains and abnormal lab results.  She is not noticed any problems since increasing the medication.  Still has right shoulder pain but less severe compared to at our visit in December.  No peripheral joint swelling.  Small amounts of skin rash intermittently but not causing a lot of symptoms.  She had recent follow-up with Dr. Jude  in pulmonology clinic findings are stable.  Not experiencing much shortness of breath does have productive cough with sputum early each morning.     Previous HPI 05/29/22 Tina Gray is a 58 y.o. female here for follow up for seropositive RA with associated ILD on prednisone  imuran  100 mg daily and prednisone  5 mg daily.  Since decreasing the prednisone  from 10 mg to 5 mg daily she has had increased soreness particularly at her bilateral shoulders.  This bothers her mostly throughout the day and a lot of time when trying to lift up with her arms or reach overhead.  She is also had some increase in rash on her elbows and forearms.  She states this has been previously attributed to psoriasis rash.  She uses a moisturizing lotion no other topical treatments.  No rash breaking out elsewhere on her body.  She is tolerating the azathioprine  100 mg without any noticeable side effect but has not seen an improvement in pain or fatigue or rash since increasing this.     Previous HPI 03/20/22 Tina Gray is a 58 y.o. female here for follow up for rheumatoid arthritis with associated ILD currently on prednisone  5 mg daily and azathioprine  50 mg daily.  We decreased the steroid dose from 10 mg after the last visit as symptoms appear well controlled and aiming to minimize side effect risks.  She has noticed some increased pain at multiple areas especially in both upper extremities and at the top of her neck since reducing the prednisone .  She is not typically seeing a lot of swelling but more pain or sensitivity to pressure.  The pain in her neck is right near the base of the skull describes tingling or burning in character and radiates along the back of her neck or back of her scalp periodically.  She particularly feels the worse symptoms when extending her neck.  Follow-up with pulmonology clinic Dr. Jude review of most recent CT scan looks stable.  Lab work checked at last visit was negative for RA serology.  She did  have a moderately elevated CK level 1178.  Liver function test have returned to a normal baseline.  Sedimentation rate and CRP were also normal.  She did have a positive ANA 1:80 titer.     Previous HPI 02/20/2022 Tina Gray is a 58 y.o. female originally from [place] here for rheumatoid arthritis previously seen with Dr. Ishmael. 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  She has some chronic cough associated but does not get particularly short of breath at baseline or significantly limiting activities.  This ILD has been attributed most likely suspected due to underlying RA but also apparently question of possible myositis in history as well. Original onset of joint pains and swelling at multiple areas including hands hips feet originally evaluated for rheumatology in 2018.  She had treatment for bilateral carpal tunnel syndrome with good improvement of hand symptoms.  However since then has had persistent joint pains pretty much all the time if she is not on a sufficient dose of prednisone .  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  Currently azathioprine  50 mg daily and prednisone  10 mg daily.  We will have some ongoing pain at her hip and feet but elsewhere doing fine and the symptoms are manageable while on the current steroid dose. She had gastroenterology evaluations as well in 2018 with abnormal LFTs but liver biopsy unremarkable for active inflammatory disease process.  Also significant work-up for dysphagia with manometry nonspecific for a cause.   Hip xray 01/30/22 Mild OA   Review of Systems  Constitutional:  Negative for fatigue.  HENT:  Negative for mouth sores and mouth dryness.   Eyes:  Positive for dryness.  Respiratory:  Negative for shortness of breath.   Cardiovascular:  Negative for chest pain  and palpitations.  Gastrointestinal:  Negative for blood in stool, constipation and diarrhea.  Endocrine: Positive for increased urination.  Genitourinary:  Negative for involuntary urination.  Musculoskeletal:  Positive for joint pain, joint pain, joint swelling, myalgias, muscle tenderness and myalgias. Negative for gait problem, muscle weakness and morning stiffness.  Skin:  Negative  for color change, rash, hair loss and sensitivity to sunlight.  Allergic/Immunologic: Negative for susceptible to infections.  Neurological:  Negative for dizziness and headaches.  Hematological:  Negative for swollen glands.  Psychiatric/Behavioral:  Positive for sleep disturbance. Negative for depressed mood. The patient is not nervous/anxious.     PMFS History:  Patient Active Problem List   Diagnosis Date Noted   Bilateral knee pain 03/06/2023   High risk medication use 02/20/2022   Unilateral primary osteoarthritis, left hip 02/20/2022   Bilateral plantar fasciitis 02/20/2022   Pulmonary nodule, left 02/20/2022   Chronic cough 08/10/2021   Rheumatoid arthritis (HCC) 08/10/2021   ILD (interstitial lung disease) (HCC) 11/28/2020   Dysphagia    Chest pain at rest 12/02/2015   HTN (hypertension) 12/02/2015   Hypokalemia 12/02/2015   Elevated troponin 12/02/2015   Chest pain 12/02/2015   Right arm pain 12/02/2015   Abnormal uterine bleeding 03/17/2013   Anemia, iron deficiency 03/05/2013    Past Medical History:  Diagnosis Date   Carpal tunnel syndrome    Chest pain    Deaf    GERD (gastroesophageal reflux disease)    Hypertension    Rheumatoid arthritis (HCC)     Family History  Problem Relation Age of Onset   Heart attack Mother    Heart disease Mother    Cancer Father    Esophageal cancer Father    Prostate cancer Brother    Healthy Son    Healthy Son    Colon cancer Neg Hx    Rectal cancer Neg Hx    Stomach cancer Neg Hx    Past Surgical History:  Procedure Laterality Date    ESOPHAGEAL MANOMETRY N/A 03/13/2016   Procedure: ESOPHAGEAL MANOMETRY (EM);  Surgeon: Victory LITTIE Brand III, MD;  Location: [facility];  Service: Gastroenterology;  Laterality: N/A;   LAPAROSCOPIC OVARIAN CYSTECTOMY     Social History   Social History Narrative   Not on file   Immunization History  Administered Date(s) Administered   Influenza,inj,Quad PF,6+ Mos 03/15/2013   Influenza-Unspecified 04/23/2023   PNEUMOCOCCAL CONJUGATE-20 02/20/2022     Objective: Vital Signs: BP 107/66 (BP Location: Right Arm, Patient Position: Sitting, Cuff Size: Normal)   Pulse (!) 56   Resp 14   Ht 5' 6 (1.676 m)   Wt 147 lb (66.7 kg)   LMP 06/10/2014   BMI 23.73 kg/m    Physical Exam  Eyes:     Conjunctiva/sclera: Conjunctivae normal.    Cardiovascular:     Rate and Rhythm: Normal rate and regular rhythm.  Pulmonary:     Effort: Pulmonary effort is normal.     Breath sounds: Normal breath sounds.   Musculoskeletal:     Right lower leg: No edema.     Left lower leg: No edema.  Lymphadenopathy:     Cervical: No cervical adenopathy.   Skin:    General: Skin is warm and dry.     Findings: No rash.   Neurological:     Mental Status: She is alert.   Psychiatric:        Mood and Affect: Mood normal.      Musculoskeletal Exam:  Shoulders full ROM no tenderness or swelling Elbows full ROM no tenderness or swelling Wrists full ROM no tenderness or swelling Fingers full ROM no tenderness or swelling Left lateral hip tenderness to pressure Knees full ROM no tenderness or swelling Ankles full ROM no tenderness or swelling Left foot tenderness to pressure on plantar  surface more severe in the ball of foot than at the calcaneus     Investigation: No additional findings.  Imaging: No results found.  Recent Labs: Lab Results  Component Value Date   WBC 5.9 12/08/2023   HGB 13.5 12/08/2023   PLT 240 12/08/2023   NA 143 12/08/2023   K 4.4 12/08/2023   CL 105  12/08/2023   CO2 29 12/08/2023   GLUCOSE 80 12/08/2023   BUN 14 12/08/2023   CREATININE 0.84 12/08/2023   BILITOT 0.7 12/08/2023   ALKPHOS 53 02/20/2022   AST 29 12/08/2023   ALT 48 (H) 12/08/2023   PROT 7.1 12/08/2023   ALBUMIN 4.0 02/20/2022   CALCIUM 9.2 12/08/2023   GFRAA >60 04/14/2017   QFTBGOLDPLUS NEGATIVE 02/20/2022    Speciality Comments: No specialty comments available.  Procedures:  No procedures performed Allergies: Patient has no known allergies.   Assessment / Plan:     Visit Diagnoses: Rheumatoid arthritis, involving unspecified site, unspecified whether rheumatoid factor present (HCC) - Plan: CBC with Differential/Platelet, azaTHIOprine  (IMURAN ) 50 MG tablet ILD (interstitial lung disease) (HCC) - Plan: CBC with Differential/Platelet, azaTHIOprine  (IMURAN ) 50 MG tablet Lung scarring and pulmonary artery enlargement managed with azathioprine . CT scan showed no new disease activity which is very reassuring and combined with lack of symptoms would suggest safety for dose reduction. Azathioprine  targets lung health, not joint issues. - Rechecking CK for disease activity monitoring - Decrease azathioprine  dose to 100 mg daily . - Continue l prednisone  at 5 mg once daily  High risk medication use - imuran  150 mg daily - Plan: Comprehensive metabolic panel with GFR, CK No serious interval infections.  Does not like taking so many pills but no particular intolerance GI or otherwise. -Checking CBC and CMP for medication monitoring continue long-term use of azathioprine .  Foot edema Chronic foot edema likely due to venous insufficiency, exacerbated by prolonged standing and walking. Edema improves with elevation and worsens throughout the day. - Advise wearing compression socks. - Encourage foot elevation when possible.  Plantar fasciitis Possible plantar fasciitis due to prolonged standing and walking. - Provide printout with exercises to manage plantar fasciitis.        Orders: Orders Placed This Encounter  Procedures   CBC with Differential/Platelet   Comprehensive metabolic panel with GFR   CK   Meds ordered this encounter  Medications   azaTHIOprine  (IMURAN ) 50 MG tablet    Sig: Take 2 tablets (100 mg total) by mouth daily.     Follow-Up Instructions: No follow-ups on file.   Lonni LELON Ester, MD  Note - This record has been created using [employer].  Chart creation errors have been sought, but may not always  have been located. Such creation errors do not reflect on  the standard of medical care.

## 2023-11-24 NOTE — Telephone Encounter (Signed)
 Last Fill: 09/01/2023  Labs: 09/03/2023 CO2 33 ALT 36  Next Visit: 12/08/2023  Last Visit: 09/03/2023  DX: Rheumatoid arthritis, involving unspecified site, unspecified whether rheumatoid factor present (HCC)   Current Dose per office note 09/03/2023: If labs stable consider AZA tapering down to 100 mg daily vs continue 150 mg   Okay to refill Imuran ?

## 2023-11-24 NOTE — Telephone Encounter (Signed)
 Received a fax requesting the patient's Azathioprine  and Prednisone  be sent to [facility]. Attempted to contact the patient and left a message to call the office back to verify the pharmacy.

## 2023-11-25 ENCOUNTER — Other Ambulatory Visit: Payer: Self-pay

## 2023-11-25 DIAGNOSIS — M069 Rheumatoid arthritis, unspecified: Secondary | ICD-10-CM

## 2023-11-25 DIAGNOSIS — J849 Interstitial pulmonary disease, unspecified: Secondary | ICD-10-CM

## 2023-11-25 NOTE — Telephone Encounter (Signed)
 Patient contacted the office and requests a refill of Prednisone  be sent to [facility].   Last Fill: 09/03/2023  Next Visit: 12/08/2023  Last Visit: 09/03/2023  Dx: Rheumatoid arthritis, involving unspecified site, unspecified whether rheumatoid factor present (HCC)   Current Dose per office note on 09/03/2023: Prednisone  5 mg daily   Last refill was sent in for 2.5 mg daily.   Okay to refill Prednisone ?

## 2023-11-25 NOTE — Telephone Encounter (Signed)
 Attempted to contact the patient to inquire if she was taking Prednisone  5 mg daily or 2.5 mg daily. Left the patient a message to call the office back.

## 2023-11-26 ENCOUNTER — Telehealth: Payer: Self-pay | Admitting: Internal Medicine

## 2023-11-26 NOTE — Telephone Encounter (Signed)
 Contacted patient to confirm where she wanted the order to be sent since we had sent the Imuran  to [facility] yesterday and it would be coming in the mail. Patient advised that was fine and also stated that her prednisone  can come from [facility] as well.

## 2023-11-26 NOTE — Telephone Encounter (Signed)
 Interpretor called and left a message in regards to pt. Pt stated she only has 3 50mg  tablets left (imuran )   Patient contacted the office to request a medication refill.   1. Name of Medication: Imuran   2. How are you currently taking this medication (dosage and times per day)? 50 mg   3. What pharmacy would you like for that to be sent to? Pt did not leave that information

## 2023-11-26 NOTE — Telephone Encounter (Signed)
 Attempted to contact the patient and left a message to call the office back.

## 2023-11-26 NOTE — Telephone Encounter (Signed)
 Patient states she is taking prednisone  1 tablet daily, which is 5 mg. Prescription changed to 5 mg dose instead of 2.5 mg. Prescription changed to 5 mg daily.

## 2023-11-28 ENCOUNTER — Telehealth: Payer: Self-pay

## 2023-11-28 NOTE — Telephone Encounter (Signed)
 Misty from [facility] left a voicemail about a patients prescription, they did not say which one. I called the pharmacy back and they confirmed that she is new to the pharmacy and they were just trying to get all her prescriptions but that as of this morning they had received them all at this time.

## 2023-12-02 MED ORDER — PREDNISONE 5 MG PO TABS: 5.0000 mg | ORAL_TABLET | Freq: Every day | ORAL | 0 refills | Status: DC

## 2023-12-08 ENCOUNTER — Encounter: Payer: Self-pay | Admitting: Internal Medicine

## 2023-12-08 ENCOUNTER — Ambulatory Visit: Attending: Internal Medicine | Admitting: Internal Medicine

## 2023-12-08 VITALS — BP 107/66 | HR 56 | Resp 14 | Ht 66.0 in | Wt 147.0 lb

## 2023-12-08 DIAGNOSIS — M069 Rheumatoid arthritis, unspecified: Secondary | ICD-10-CM

## 2023-12-08 DIAGNOSIS — Z7952 Long term (current) use of systemic steroids: Secondary | ICD-10-CM | POA: Diagnosis not present

## 2023-12-08 DIAGNOSIS — M722 Plantar fascial fibromatosis: Secondary | ICD-10-CM

## 2023-12-08 DIAGNOSIS — M1612 Unilateral primary osteoarthritis, left hip: Secondary | ICD-10-CM

## 2023-12-08 DIAGNOSIS — J849 Interstitial pulmonary disease, unspecified: Secondary | ICD-10-CM | POA: Diagnosis not present

## 2023-12-08 DIAGNOSIS — Z79899 Other long term (current) drug therapy: Secondary | ICD-10-CM | POA: Diagnosis not present

## 2023-12-08 MED ORDER — AZATHIOPRINE 50 MG PO TABS: 100.0000 mg | ORAL_TABLET | Freq: Every day | ORAL | Status: DC

## 2023-12-09 LAB — CBC WITH DIFFERENTIAL/PLATELET
Absolute Lymphocytes: 1162 {cells}/uL (ref 850–3900)
Absolute Monocytes: 531 {cells}/uL (ref 200–950)
Basophils Absolute: 41 {cells}/uL (ref 0–200)
Basophils Relative: 0.7 %
Eosinophils Absolute: 71 {cells}/uL (ref 15–500)
Eosinophils Relative: 1.2 %
HCT: 41.4 % (ref 35.0–45.0)
Hemoglobin: 13.5 g/dL (ref 11.7–15.5)
MCH: 32.8 pg (ref 27.0–33.0)
MCHC: 32.6 g/dL (ref 32.0–36.0)
MCV: 100.5 fL — ABNORMAL HIGH (ref 80.0–100.0)
MPV: 11.7 fL (ref 7.5–12.5)
Monocytes Relative: 9 %
Neutro Abs: 4095 {cells}/uL (ref 1500–7800)
Neutrophils Relative %: 69.4 %
Platelets: 240 10*3/uL (ref 140–400)
RBC: 4.12 10*6/uL (ref 3.80–5.10)
RDW: 12.2 % (ref 11.0–15.0)
Total Lymphocyte: 19.7 %
WBC: 5.9 10*3/uL (ref 3.8–10.8)

## 2023-12-09 LAB — COMPREHENSIVE METABOLIC PANEL WITH GFR
AG Ratio: 1.6 (calc) (ref 1.0–2.5)
ALT: 48 U/L — ABNORMAL HIGH (ref 6–29)
AST: 29 U/L (ref 10–35)
Albumin: 4.4 g/dL (ref 3.6–5.1)
Alkaline phosphatase (APISO): 66 U/L (ref 37–153)
BUN: 14 mg/dL (ref 7–25)
CO2: 29 mmol/L (ref 20–32)
Calcium: 9.2 mg/dL (ref 8.6–10.4)
Chloride: 105 mmol/L (ref 98–110)
Creat: 0.84 mg/dL (ref 0.50–1.03)
Globulin: 2.7 g/dL (ref 1.9–3.7)
Glucose, Bld: 80 mg/dL (ref 65–99)
Potassium: 4.4 mmol/L (ref 3.5–5.3)
Sodium: 143 mmol/L (ref 135–146)
Total Bilirubin: 0.7 mg/dL (ref 0.2–1.2)
Total Protein: 7.1 g/dL (ref 6.1–8.1)
eGFR: 80 mL/min/{1.73_m2} (ref >=60)

## 2023-12-09 LAB — CK: Total CK: 564 U/L — ABNORMAL HIGH (ref 21–240)

## 2023-12-22 ENCOUNTER — Other Ambulatory Visit: Payer: Self-pay | Admitting: Internal Medicine

## 2023-12-22 DIAGNOSIS — M069 Rheumatoid arthritis, unspecified: Secondary | ICD-10-CM

## 2023-12-22 DIAGNOSIS — J849 Interstitial pulmonary disease, unspecified: Secondary | ICD-10-CM

## 2023-12-23 ENCOUNTER — Ambulatory Visit (INDEPENDENT_AMBULATORY_CARE_PROVIDER_SITE_OTHER): Admitting: Primary Care

## 2023-12-23 ENCOUNTER — Encounter: Payer: Self-pay | Admitting: Primary Care

## 2023-12-23 VITALS — BP 118/60 | HR 64 | Temp 97.6°F | Ht 66.0 in | Wt 146.0 lb

## 2023-12-23 DIAGNOSIS — M069 Rheumatoid arthritis, unspecified: Secondary | ICD-10-CM

## 2023-12-23 DIAGNOSIS — J849 Interstitial pulmonary disease, unspecified: Secondary | ICD-10-CM | POA: Diagnosis not present

## 2023-12-23 DIAGNOSIS — R053 Chronic cough: Secondary | ICD-10-CM

## 2023-12-23 MED ORDER — ALBUTEROL SULFATE (2.5 MG/3ML) 0.083% IN NEBU: 2.5000 mg | INHALATION_SOLUTION | Freq: Four times a day (QID) | RESPIRATORY_TRACT | 3 refills | Status: AC | PRN

## 2023-12-23 MED ORDER — DM-GUAIFENESIN ER 30-600 MG PO TB12: 1.0000 | ORAL_TABLET | Freq: Two times a day (BID) | ORAL | 1 refills | Status: AC | PRN

## 2023-12-23 MED ORDER — IPRATROPIUM BROMIDE 0.03 % NA SOLN: 2.0000 | Freq: Two times a day (BID) | NASAL | 12 refills | Status: AC

## 2023-12-23 MED ORDER — ALBUTEROL SULFATE (2.5 MG/3ML) 0.083% IN NEBU
2.5000 mg | INHALATION_SOLUTION | Freq: Once | RESPIRATORY_TRACT | Status: AC
  Administered 2023-12-23: 2.5 mg via RESPIRATORY_TRACT

## 2023-12-23 NOTE — Patient Instructions (Addendum)
-  INTERSTITIAL LUNG DISEASE: Your interstitial lung disease is stable, meaning there has been no significant change in your lung condition. Continue taking azathioprine  100 mg daily and prednisone  5 mg daily. We will schedule a breathing test in six months and perform a sit-stand test to check your oxygen level. Use your rescue inhaler (albuterol ) before exertion, but not more frequently than every four hours.  -CHRONIC COUGH: Your chronic cough has been ongoing for about two years and is likely due to multiple factors, including postnasal drip and GERD. To help manage your cough, try Mucinex DM to thin mucus, use ipratropium nasal spray twice a day, restart cetirizine  for postnasal drip. Stop using Flonase if it is not helping, and we may consider a longer-acting bronchodilator if you are overusing albuterol .  -GASTROESOPHAGEAL REFLUX DISEASE (GERD): GERD is when stomach acid frequently flows back into the tube connecting your mouth and stomach, causing heartburn and other symptoms. Continue taking Protonix  twice a day and Pepcid  at bedtime.   -RHEUMATOID ARTHRITIS: Rheumatoid arthritis is an autoimmune disease that causes joint inflammation and pain. Your condition is managed by rheumatology with azathioprine  and prednisone , which also help manage your interstitial lung disease. Continue your current management with rheumatology oversight.  INSTRUCTIONS: Please schedule a breathing test in six months for routine follow-up. We will also perform a sit-stand test to check your oxygen level. If you have any questions or concerns, please contact our office.  Follow-up 6 months with Dr. Lura 30 mins PFT prior- [facility]

## 2023-12-23 NOTE — Progress Notes (Signed)
 @Patient  ID: Tina Gray, female    DOB: Sep 29, 1965, 58 y.o.   MRN: [id number]  No chief complaint on file.   Referring provider: Delores Rojelio Caldron, NP  HPI: 58 year old female, never smoked. PMH hypertension, ILD, left pulmonary nodule, rheumatoid arthritis, iron deficiency anemia.   Previous [facility] pulmonary encounter: 12/08/23- Dr. Jude  58 yo deaf, never smoker for FU of of CT -ILD. 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  PCP - [facility], [facility]   Tina Gray was discharged from Dr. Jacob' practice for missed appointments.   Lisinopril  changed to ARB due to persistent cough Tina Gray was started on azathioprine  due to hepatitis, also maintained on 5 mg prednisone     12/23/2023- Interim hx  Discussed the use of AI scribe software for clinical note transcription with the patient, who gave verbal consent to proceed.  History of Present Illness   Tina Gray is a 58 year old female with interstitial lung disease and rheumatoid arthritis who presents for an overdue follow-up. Accompanied by sign language interpreter.  Her interstitial lung disease is stable based on pulmonary function testing and recent CT imaging. A high-resolution CT scan in April 2025 showed stable fibrosis compared to 2023. Tina Gray follows with rheumatology for rheumatoid arthritis and was last seen in June 2025.  Tina Gray is currently on azathioprine  (Imuran ) for her lung condition, with a reduced dose of 100 mg daily. Tina Gray continues to take prednisone  5 mg daily. No serious interval infections have occurred.  Tina Gray has been experiencing a persistent cough for approximately two years, which occurs daily but has improved over time. The cough is more frequent after eating and upon waking in the morning. Tina Gray was previously on lisinopril , which was stopped due to its potential to cause cough, and her blood  pressure medication was changed to amlodipine. Tina Gray is also on Protonix  twice a day and Pepcid  at bedtime for reflux, which Tina Gray notes can contribute to her cough. Tina Gray experiences heartburn and reflux, particularly when sleeping on her right side, and has to reposition to her left side to alleviate symptoms.  Tina Gray reports postnasal drip and frequently spits up a white substance. Tina Gray was taking cetirizine  (Zyrtec ) for allergies and postnasal drip.  Tina Gray uses albuterol  as a rescue inhaler, particularly when engaging in activities like climbing stairs or carrying heavy objects, which cause her to become out of breath. Tina Gray acknowledges using the inhaler more frequently than recommended, sometimes less than four hours apart.  Tina Gray has never smoked and reports that her cough and breathing difficulties impact her daily activities, particularly when exerting herself physically.      No Known Allergies  Immunization History  Administered Date(s) Administered   Influenza,inj,Quad PF,6+ Mos 03/15/2013   Influenza-Unspecified 04/23/2023   PNEUMOCOCCAL CONJUGATE-20 02/20/2022    Past Medical History:  Diagnosis Date   Carpal tunnel syndrome    Chest pain    Deaf    GERD (gastroesophageal reflux disease)    Hypertension    Rheumatoid arthritis (HCC)     Tobacco History: Social History   Tobacco Use  Smoking Status Never   Passive exposure: Never  Smokeless Tobacco Never   Counseling given: Not Answered   Outpatient Medications Prior to Visit  Medication Sig Dispense Refill   albuterol  (VENTOLIN  HFA) 108 (90 Base) MCG/ACT inhaler Inhale 2 puffs into  the lungs every 6 (six) hours as needed for wheezing or shortness of breath. 8 g 6   amLODipine (NORVASC) 10 MG tablet      atorvastatin (LIPITOR) 10 MG tablet Take 10 mg by mouth daily.     azaTHIOprine  (IMURAN ) 50 MG tablet Take 2 tablets (100 mg total) by mouth daily.     benzonatate  (TESSALON ) 200 MG capsule Take 1 capsule (200 mg total) by  mouth 3 (three) times daily as needed for cough. (Patient not taking: Reported on 12/08/2023) 45 capsule 2   famotidine  (PEPCID ) 20 MG tablet TAKE 1 TABLET BY MOUTH EVERYDAY AT BEDTIME 30 tablet 5   fluticasone (FLONASE) 50 MCG/ACT nasal spray Place 1 spray into both nostrils 2 (two) times daily.     HYDROcodone  bit-homatropine (HYCODAN) 5-1.5 MG/5ML syrup Take 5 mLs by mouth every 6 (six) hours as needed for cough. (Patient not taking: Reported on 12/08/2023) 120 mL 0   meloxicam  (MOBIC ) 15 MG tablet Take 15 mg by mouth. (Patient not taking: Reported on 12/08/2023)     olmesartan  (BENICAR ) 20 MG tablet Take 1 tablet (20 mg total) by mouth daily. 30 tablet 3   pantoprazole  (PROTONIX ) 40 MG tablet Take 40 mg by mouth 2 (two) times daily.     predniSONE  (DELTASONE ) 5 MG tablet Take 1 tablet (5 mg total) by mouth daily with breakfast. 90 tablet 0   Vitamin D, Ergocalciferol, (DRISDOL) 1.25 MG (50000 UNIT) CAPS capsule Take 50,000 Units by mouth once a week. (Patient not taking: Reported on 12/08/2023)     No facility-administered medications prior to visit.      Review of Systems  Review of Systems  Constitutional: Negative.   HENT:  Positive for postnasal drip.   Respiratory:  Positive for cough. Negative for chest tightness, shortness of breath and wheezing.   Cardiovascular: Negative.    Physical Exam  LMP 06/10/2014  Physical Exam Constitutional:      General: Tina Gray is not in acute distress.    Appearance: Normal appearance. Tina Gray is not ill-appearing.  HENT:     Head: Normocephalic and atraumatic.     Mouth/Throat:     Mouth: Mucous membranes are moist.     Pharynx: Oropharynx is clear.   Cardiovascular:     Rate and Rhythm: Normal rate and regular rhythm.  Pulmonary:     Effort: Pulmonary effort is normal.     Breath sounds: Normal breath sounds. No wheezing or rhonchi.     Comments: R>L  Musculoskeletal:        General: Normal range of motion.   Skin:    General: Skin is  warm and dry.   Neurological:     General: No focal deficit present.     Mental Status: Tina Gray is alert and oriented to person, place, and time.   Psychiatric:        Mood and Affect: Mood normal.        Behavior: Behavior normal.        Thought Content: Thought content normal.        Judgment: Judgment normal.      Lab Results:  CBC    Component Value Date/Time   WBC 5.9 12/08/2023 1442   RBC 4.12 12/08/2023 1442   HGB 13.5 12/08/2023 1442   HCT 41.4 12/08/2023 1442   PLT 240 12/08/2023 1442   MCV 100.5 (H) 12/08/2023 1442   MCH 32.8 12/08/2023 1442   MCHC 32.6 12/08/2023 1442   RDW 12.2 12/08/2023  1442   LYMPHSABS 1,120 03/06/2023 1152   MONOABS 0.4 02/20/2022 1241   EOSABS 71 12/08/2023 1442   BASOSABS 41 12/08/2023 1442    BMET    Component Value Date/Time   NA 143 12/08/2023 1442   K 4.4 12/08/2023 1442   CL 105 12/08/2023 1442   CO2 29 12/08/2023 1442   GLUCOSE 80 12/08/2023 1442   BUN 14 12/08/2023 1442   CREATININE 0.84 12/08/2023 1442   CALCIUM 9.2 12/08/2023 1442   GFRNONAA >60 04/14/2017 1205   GFRAA >60 04/14/2017 1205    BNP No results found for: BNP  ProBNP No results found for: PROBNP  Imaging: No results found.   Assessment & Plan:   1. ILD (interstitial lung disease) (HCC) (Primary) - Pulmonary Function Test; Future  2. Chronic cough - Pulmonary Function Test; Future - albuterol  (PROVENTIL ) (2.5 MG/3ML) 0.083% nebulizer solution 2.5 mg  3. 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 Interstitial lung disease is well-managed with stable fibrosis compared to 2023. Minimal symptoms are present, related to rheumatoid arthritis, with no serious interval infections. - Continue azathioprine  100 mg daily as per rheumatology recommendation. - Continue prednisone  5 mg daily. -  Schedule a breathing test in six months for routine follow-up. - Perform a sit-stand test to check oxygen level. - Consider using a rescue inhaler (albuterol ) before exertion, but not more frequently than every four hours.  Chronic cough Chronic cough has persisted for approximately two years with noted improvement. It occurs daily, more frequently after eating and upon waking. Contributing factors include postnasal drip, GERD, and previous lisinopril  use. Complete resolution may be hindered by fibrosis or other factors. Overuse of albuterol  inhaler suggests potential need for a longer-acting bronchodilator. - Try Mucinex DM to help thin mucus and provide cough suppression. - Use ipratropium nasal spray twice a day. - Restart cetirizine  for postnasal drip. - Evaluate the use of a nebulizer in the office to determine if it helps reduce cough frequency. - Consider a longer-acting bronchodilator if albuterol  is overused.  Allergic rhinitis - Stop Flonase if it is not helping. - Use ipratropium nasal spray twice a day. - Restart cetirizine  for postnasal drip.  Gastroesophageal reflux disease (GERD) GERD is contributing to the chronic cough. Symptoms are exacerbated when sleeping on the right side. - Continue Protonix  twice a day and Pepcid  at bedtime. - Advise sleeping on the left side to reduce reflux symptoms.  Rheumatoid arthritis Rheumatoid arthritis is managed by rheumatology with azathioprine  and prednisone , which also aid in managing interstitial lung disease. - Continue current management with rheumatology oversight.  Recording duration: 29 minutes      Almarie LELON Ferrari, NP 12/23/2023

## 2024-01-21 ENCOUNTER — Other Ambulatory Visit: Payer: Self-pay | Admitting: Internal Medicine

## 2024-01-21 DIAGNOSIS — M069 Rheumatoid arthritis, unspecified: Secondary | ICD-10-CM

## 2024-01-21 DIAGNOSIS — J849 Interstitial pulmonary disease, unspecified: Secondary | ICD-10-CM

## 2024-01-22 NOTE — Telephone Encounter (Signed)
 Contacted the patient and she does not need a refill.

## 2024-02-25 NOTE — Progress Notes (Signed)
 Office Visit Note  Patient: Tina Gray             Date of Birth: 1966-01-25           MRN: [id number]             PCP: Delores Rojelio Caldron, NP Referring: Delores Rojelio Caldron, NP Visit Date: 03/09/2024   Subjective:  Rheumatoid Arthritis (Patient states she is having dry skin and peeling on hands and face. Patient states is has been going on for about 1 month.   Patient states she has not seen PCP or dermatologist. )   Discussed the use of AI scribe software for clinical note transcription with the patient, who gave verbal consent to proceed.  History of Present Illness   Tina Gray is a 58 y.o. 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     She has a new onset cough that began yesterday, described as 'awful' and causing fatigue. She is uncertain if it worsens at night compared to the daytime. She continues to experience postnasal drip, which has been a persistent issue.  She reports dry skin on her face and hands, particularly around the fingertips, noticeable for about a month. The dryness has worsened over time, and she describes the skin as 'dry, dry' and sometimes cold in those areas. She has not been moisturizing her hands regularly.  She mentions a history of wrist pain, which has improved over time and is now less frequent. She also has a history of neck pain associated with arthritis, described as a 'pop, pop, pops' sensation when turning her neck, particularly on one side. The pain is described as 'a little bit hurts'.  She is currently taking her medications as prescribed and feels normal with them.     Encounter was conducted with assistance of in person sign language interpreter   Previous HPI 12/08/2023 Tina Gray is a 58 y.o. 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     Her recent chest scan  showed no changes compared to the scan from 2023.  She has not experienced any persistent symptoms with cough dyspnea or chest pain.   She experiences inflammation primarily in her feet, which she attributes to walking a lot at work. The inflammation worsens throughout the day but improves by morning. Her feet are swollen daily, and she tries to elevate them when possible. Wearing compression socks helps with the swelling.   She is concerned about the impact of her medication on her blood test results, noting that her blood test numbers have not improved.    No recent illness, color changes, or circulation problems in her feet. She experiences pain in her back and hip area, which she describes as 'the same as it's always been.' She also mentions a history of plantar fasciitis due to being on her feet a lot.   She takes eight medications in total and wants to reduce her medication burden.      Previous HPI 09/03/2023 Tina Gray is a 58 y.o. 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 Overall symptoms have been under control without any worsening in cough, shortness of breath, and no weakness or joint swelling. She does have  some increase in shoulder pain on the left side. This hurts with lying on the side, lifting anything heavy, and overhead movement. Not radiating in the arm. Symptoms improve partially with her medications and at rest.   She has been sick with URI symptoms shared with her grandchildren but improved without additional medication.   Encounter was conducted with assistance of in person sign language interpreter.       Previous HPI 06/05/23 Tina Gray is a 58 y.o. 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  She has a few painful areas of complaint today.   Has a new nodule on the top side of her right middle finger.  The pain is exacerbated by contact and occasionally radiates to the ring finger and down to the wrist.  She does not recall any injury or change in activity associated. The patient also reports a deep, internal pain in their thigh, which becomes particularly severe during periods of standing or walking. The patient has a history of receiving injections for arthritis in the thigh in 2019, and wonders if this could be related to the current pain. The patient denies any swelling or bumps in the leg, and reports that the knee is generally fine, although it occasionally swells.   The patient also reports pain in their shoulder, which they describe as a sharp, jabbing sensation, particularly when lifting or turning their arm in certain ways. The pain sometimes radiates down the arm. The patient's work involves lifting boxes, which may exacerbate the shoulder pain. The patient has a history of receiving injections for knee pain, which they report provided some relief.    Encounter was conducted with assistance of in person sign language interpreter.   Previous HPI 03/06/2023 Tina Gray is a 58 y.o. 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  Last CK level was slightly more elevated over 400 but without definite corresponding symptoms.  Currently has some increased pain in both knees.  Tenderness is mostly towards the medial side of the joint.  Especially with certain twisting movements.  Not seeing any visible swelling.  She broke her right fifth toe forcibly opening a door onto it evaluated by podiatry this has improved with no particular intervention.  Also has pain on the bottoms of both heels this is most intense first thing in the morning and worse on left foot.  No cough or shortness of breath.   Encounter  was conducted with the assistance of in person sign language interpreter.   Previous HPI 12/02/2022 Tina Gray is a 58 y.o. 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 Since our last visit she broke her right 5th toe by shutting a door on it and had this reduced externally. Has some increased pain in the shoulders and in right knee. Not noticing visible swelling. Shoulder pain is worst when trying to pull or lift heavy weight. Knee pain feels worst at the back of the knee. She has had similar pain in the past for which she had steroid injections years ago, but had been doing well off any  specific treatment.   Previous HPI 08/28/22 Tina Gray is a 58 y.o. female here for follow up for seropositive RA with overlap associated ILD and persistent elevated CK on Imuran  150 mg daily and prednisone  5 mg daily.  Azathioprine  dose was increased after last visit due to some ongoing joint pains and abnormal lab results.  She is not noticed any problems since increasing the medication.  Still has right shoulder pain but less severe compared to at our visit in December.  No peripheral joint swelling.  Small amounts of skin rash intermittently but not causing a lot of symptoms.  She had recent follow-up with Dr. Jude in pulmonology clinic findings are stable.  Not experiencing much shortness of breath does have productive cough with sputum early each morning.     Previous HPI 05/29/22 Tina Gray is a 58 y.o. female here for follow up for seropositive RA with associated ILD on prednisone  imuran  100 mg daily and prednisone  5 mg daily.  Since decreasing the prednisone  from 10 mg to 5 mg daily she has had increased soreness particularly at her bilateral shoulders.  This bothers her mostly throughout the day and a lot of time when trying to lift up with her arms or reach overhead.   She is also had some increase in rash on her elbows and forearms.  She states this has been previously attributed to psoriasis rash.  She uses a moisturizing lotion no other topical treatments.  No rash breaking out elsewhere on her body.  She is tolerating the azathioprine  100 mg without any noticeable side effect but has not seen an improvement in pain or fatigue or rash since increasing this.     Previous HPI 03/20/22 Tina Gray is a 58 y.o. female here for follow up for rheumatoid arthritis with associated ILD currently on prednisone  5 mg daily and azathioprine  50 mg daily.  We decreased the steroid dose from 10 mg after the last visit as symptoms appear well controlled and aiming to minimize side effect risks.  She has noticed some increased pain at multiple areas especially in both upper extremities and at the top of her neck since reducing the prednisone .  She is not typically seeing a lot of swelling but more pain or sensitivity to pressure.  The pain in her neck is right near the base of the skull describes tingling or burning in character and radiates along the back of her neck or back of her scalp periodically.  She particularly feels the worse symptoms when extending her neck.  Follow-up with pulmonology clinic Dr. Jude review of most recent CT scan looks stable.  Lab work checked at last visit was negative for RA serology.  She did have a moderately elevated CK level 1178.  Liver function test have returned to a normal baseline.  Sedimentation rate and CRP were also normal.  She did have a positive ANA 1:80 titer.     Previous HPI 02/20/2022 Tina Gray is a 58 y.o. female originally from [place] here for rheumatoid arthritis previously seen with Dr. Ishmael. 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  She has some chronic cough associated but does not get particularly short of breath at baseline or  significantly limiting activities.  This ILD has been attributed most likely suspected due to underlying RA but also apparently question of possible myositis in history as well. Original onset of joint pains and  swelling at multiple areas including hands hips feet originally evaluated for rheumatology in 2018.  She had treatment for bilateral carpal tunnel syndrome with good improvement of hand symptoms.  However since then has had persistent joint pains pretty much all the time if she is not on a sufficient dose of prednisone .  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  Currently azathioprine  50 mg daily and prednisone  10 mg daily.  We will have some ongoing pain at her hip and feet but elsewhere doing fine and the symptoms are manageable while on the current steroid dose. She had gastroenterology evaluations as well in 2018 with abnormal LFTs but liver biopsy unremarkable for active inflammatory disease process.  Also significant work-up for dysphagia with manometry nonspecific for a cause.   Hip xray 01/30/22 Mild OA   Review of Systems  Constitutional:  Positive for fatigue.  HENT:  Negative for mouth sores and mouth dryness.   Eyes:  Negative for dryness.  Respiratory:  Negative for shortness of breath.   Cardiovascular:  Negative for chest pain and palpitations.  Gastrointestinal:  Negative for blood in stool, constipation and diarrhea.  Endocrine: Positive for increased urination.  Genitourinary:  Positive for involuntary urination.  Musculoskeletal:  Positive for joint pain, joint pain and morning stiffness. Negative for gait problem, joint swelling, myalgias, muscle weakness, muscle tenderness and myalgias.  Skin:  Negative for color change, rash, hair loss and sensitivity to sunlight.  Allergic/Immunologic: Negative for susceptible to infections.  Neurological:  Positive for dizziness.  Negative for headaches.  Hematological:  Negative for swollen glands.  Psychiatric/Behavioral:  Positive for sleep disturbance. Negative for depressed mood. The patient is not nervous/anxious.     PMFS History:  Patient Active Problem List   Diagnosis Date Noted   Long term current use of systemic steroids 03/09/2024   Bilateral knee pain 03/06/2023   High risk medication use 02/20/2022   Unilateral primary osteoarthritis, left hip 02/20/2022   Bilateral plantar fasciitis 02/20/2022   Pulmonary nodule, left 02/20/2022   Chronic cough 08/10/2021   Rheumatoid arthritis (HCC) 08/10/2021   ILD (interstitial lung disease) (HCC) 11/28/2020   Dysphagia    Chest pain at rest 12/02/2015   HTN (hypertension) 12/02/2015   Hypokalemia 12/02/2015   Elevated troponin 12/02/2015   Chest pain 12/02/2015   Right arm pain 12/02/2015   Abnormal uterine bleeding 03/17/2013   Anemia, iron deficiency 03/05/2013    Past Medical History:  Diagnosis Date   Carpal tunnel syndrome    Chest pain    Deaf    GERD (gastroesophageal reflux disease)    Hypertension    Rheumatoid arthritis (HCC)     Family History  Problem Relation Age of Onset   Heart attack Mother    Heart disease Mother    Cancer Father    Esophageal cancer Father    Prostate cancer Brother    Healthy Son    Healthy Son    Colon cancer Neg Hx    Rectal cancer Neg Hx    Stomach cancer Neg Hx    Past Surgical History:  Procedure Laterality Date   ESOPHAGEAL MANOMETRY N/A 03/13/2016   Procedure: ESOPHAGEAL MANOMETRY (EM);  Surgeon: Victory LITTIE Brand III, MD;  Location: [facility];  Service: Gastroenterology;  Laterality: N/A;   LAPAROSCOPIC OVARIAN CYSTECTOMY     Social History   Social History Narrative   Not on file  Immunization History  Administered Date(s) Administered   Influenza,inj,Quad PF,6+ Mos 03/15/2013   Influenza-Unspecified 04/23/2023   PNEUMOCOCCAL CONJUGATE-20 02/20/2022     Objective: Vital Signs: BP  (!) 98/50 (BP Location: Right Arm, Patient Position: Sitting, Cuff Size: Small)   Pulse 68   Temp 97.9 F (36.6 C)   Resp 15   Ht 5' 6 (1.676 m)   Wt 145 lb (65.8 kg)   LMP 06/10/2014   BMI 23.40 kg/m    Physical Exam Eyes:     Conjunctiva/sclera: Conjunctivae normal.  Cardiovascular:     Rate and Rhythm: Normal rate and regular rhythm.  Pulmonary:     Effort: Pulmonary effort is normal.     Breath sounds: Normal breath sounds.  Lymphadenopathy:     Cervical: No cervical adenopathy.  Skin:    General: Skin is warm and dry.     Comments: Extremely dry skin on bilateral hands and on face  Neurological:     Mental Status: She is alert.  Psychiatric:        Mood and Affect: Mood normal.      Musculoskeletal Exam:  Neck full ROM intact but pain worst in back of beck with side to side rotataion while held in extended position Shoulders full ROM no tenderness or swelling Elbows full ROM no tenderness or swelling Wrists full ROM no tenderness or swelling Fingers full ROM no tenderness or swelling Left lateral hip tenderness to pressure Knees full ROM no tenderness or swelling Ankles full ROM no tenderness or swelling    Investigation: No additional findings.  Imaging: DG Chest 2 View Result Date: 03/16/2024 CLINICAL DATA:  Right-sided pleuritic chest pains with cough for 5 days. EXAM: DG CHEST 2V COMPARISON:  06/12/2023.  High-resolution chest CT, 10/22/2023. FINDINGS: Cardiac silhouette is borderline enlarged. No mediastinal or hilar masses. No evidence of adenopathy. Interstitial thickening with intervening hazy airspace lung opacities noted in the lower lungs consistent with fibrosis and stable from the prior exams. Remainder of the lungs is clear No pleural effusion or pneumothorax. Skeletal structures are intact. IMPRESSION: 1. No acute cardiopulmonary disease. 2. Lower lung interstitial and airspace opacities consistent with interstitial lung disease, without  significant change from the recent prior studies and better defined on the prior high-resolution chest CT. Electronically Signed   By: Alm Parkins M.D.   On: 03/16/2024 15:09    Recent Labs: Lab Results  Component Value Date   WBC 8.2 03/09/2024   HGB 12.7 03/09/2024   PLT 238 03/09/2024   NA 142 03/09/2024   K 3.8 03/09/2024   CL 104 03/09/2024   CO2 29 03/09/2024   GLUCOSE 87 03/09/2024   BUN 11 03/09/2024   CREATININE 0.83 03/09/2024   BILITOT 0.9 03/09/2024   ALKPHOS 53 02/20/2022   AST 39 (H) 03/09/2024   ALT 45 (H) 03/09/2024   PROT 6.8 03/09/2024   ALBUMIN 4.0 02/20/2022   CALCIUM 8.8 03/09/2024   GFRAA >60 04/14/2017   QFTBGOLDPLUS NEGATIVE 02/20/2022    Speciality Comments: No specialty comments available.  Procedures:  No procedures performed Allergies: Patient has no known allergies.   Assessment / Plan:     Visit Diagnoses: Rheumatoid arthritis, involving unspecified site, unspecified whether rheumatoid factor present (HCC) - Plan: CK Wrist pain significantly decreased compared to last visit without direct intervention, almost normal. No medication issues or new symptoms of active disease.  If CK has improved further we might build to titrate down her medication.  If staying moderately  elevated in the 500s or higher I would not recommend any medication reduction currently. -Checking CK level for disease activity monitoring - Continue azathioprine  150 mg daily - Continue prednisone  5 mg daily  ILD (interstitial lung disease) (HCC) - Plan: CK  High risk medication use - imuran  150 mg daily - Plan: CBC with Differential/Platelet, Comprehensive metabolic panel with GFR, Lipid panel, Hemoglobin A1c - Checking CBC CMP lipid panel and hemoglobin A1c for medication monitoring and long-term use of azathioprine  as well as low-dose glucocorticoids  Cervical facet joint arthritis Neck pain likely due to cervical facet joint arthritis, especially on the left, possibly  at C3. Pain worsens with neck extension and rotation. Not inflammatory or surgical. - Recommend neck exercises. - Consider referral to primary care physician for further management if needed.  Severely dry skin of hands and face (xerosis cutis) Severe dryness, especially around fingertips. No fungal infection. Previous lotion use ineffective, needs intensive moisturizing. - Recommend thick ointments like Aquaphor or Cerave. - Advise applying Vaseline and wearing gloves at night. - Avoid frequent hand washing.        Orders: Orders Placed This Encounter  Procedures   CK   CBC with Differential/Platelet   Comprehensive metabolic panel with GFR   Lipid panel   Hemoglobin A1c   No orders of the defined types were placed in this encounter.    Follow-Up Instructions: Return in about 3 months (around 06/08/2024) for RA/ILD on AZA/GC f/u 3mos.   Lonni LELON Ester, MD  Note - This record has been created using [employer].  Chart creation errors have been sought, but may not always  have been located. Such creation errors do not reflect on  the standard of medical care.

## 2024-03-09 ENCOUNTER — Encounter: Payer: Self-pay | Admitting: Internal Medicine

## 2024-03-09 ENCOUNTER — Ambulatory Visit: Attending: Internal Medicine | Admitting: Internal Medicine

## 2024-03-09 VITALS — BP 98/50 | HR 68 | Temp 97.9°F | Resp 15 | Ht 66.0 in | Wt 145.0 lb

## 2024-03-09 DIAGNOSIS — M069 Rheumatoid arthritis, unspecified: Secondary | ICD-10-CM

## 2024-03-09 DIAGNOSIS — Z79899 Other long term (current) drug therapy: Secondary | ICD-10-CM

## 2024-03-09 DIAGNOSIS — J849 Interstitial pulmonary disease, unspecified: Secondary | ICD-10-CM

## 2024-03-09 DIAGNOSIS — Z7952 Long term (current) use of systemic steroids: Secondary | ICD-10-CM

## 2024-03-09 NOTE — Patient Instructions (Signed)
 I recommend using a strong moisturizing ointment on your hands such as aquaphor, cerave, or could use vaseline at night to keep in moisture.

## 2024-03-10 ENCOUNTER — Ambulatory Visit: Payer: Self-pay | Admitting: Internal Medicine

## 2024-03-10 ENCOUNTER — Encounter: Payer: Self-pay | Admitting: *Deleted

## 2024-03-10 LAB — CBC WITH DIFFERENTIAL/PLATELET
Absolute Lymphocytes: 500 {cells}/uL — ABNORMAL LOW (ref 850–3900)
Absolute Monocytes: 525 {cells}/uL (ref 200–950)
Basophils Absolute: 41 {cells}/uL (ref 0–200)
Basophils Relative: 0.5 %
Eosinophils Absolute: 107 {cells}/uL (ref 15–500)
Eosinophils Relative: 1.3 %
HCT: 39.4 % (ref 35.0–45.0)
Hemoglobin: 12.7 g/dL (ref 11.7–15.5)
MCH: 31.7 pg (ref 27.0–33.0)
MCHC: 32.2 g/dL (ref 32.0–36.0)
MCV: 98.3 fL (ref 80.0–100.0)
MPV: 11.5 fL (ref 7.5–12.5)
Monocytes Relative: 6.4 %
Neutro Abs: 7027 {cells}/uL (ref 1500–7800)
Neutrophils Relative %: 85.7 %
Platelets: 238 Thousand/uL (ref 140–400)
RBC: 4.01 Million/uL (ref 3.80–5.10)
RDW: 12.6 % (ref 11.0–15.0)
Total Lymphocyte: 6.1 %
WBC: 8.2 Thousand/uL (ref 3.8–10.8)

## 2024-03-10 LAB — COMPREHENSIVE METABOLIC PANEL WITH GFR
AG Ratio: 1.5 (calc) (ref 1.0–2.5)
ALT: 45 U/L — ABNORMAL HIGH (ref 6–29)
AST: 39 U/L — ABNORMAL HIGH (ref 10–35)
Albumin: 4.1 g/dL (ref 3.6–5.1)
Alkaline phosphatase (APISO): 68 U/L (ref 37–153)
BUN: 11 mg/dL (ref 7–25)
CO2: 29 mmol/L (ref 20–32)
Calcium: 8.8 mg/dL (ref 8.6–10.4)
Chloride: 104 mmol/L (ref 98–110)
Creat: 0.83 mg/dL (ref 0.50–1.03)
Globulin: 2.7 g/dL (ref 1.9–3.7)
Glucose, Bld: 87 mg/dL (ref 65–99)
Potassium: 3.8 mmol/L (ref 3.5–5.3)
Sodium: 142 mmol/L (ref 135–146)
Total Bilirubin: 0.9 mg/dL (ref 0.2–1.2)
Total Protein: 6.8 g/dL (ref 6.1–8.1)
eGFR: 81 mL/min/1.73m2 (ref >=60)

## 2024-03-10 LAB — LIPID PANEL
Cholesterol: 107 mg/dL (ref <200)
HDL: 55 mg/dL (ref >=50)
LDL Cholesterol (Calc): 40 mg/dL
Non-HDL Cholesterol (Calc): 52 mg/dL (ref <130)
Total CHOL/HDL Ratio: 1.9 (calc) (ref <5.0)
Triglycerides: 43 mg/dL (ref <150)

## 2024-03-10 LAB — HEMOGLOBIN A1C
Hgb A1c MFr Bld: 5.4 % (ref <5.7)
Mean Plasma Glucose: 108 mg/dL
eAG (mmol/L): 6 mmol/L

## 2024-03-10 LAB — CK: Total CK: 1196 U/L — ABNORMAL HIGH (ref 21–240)

## 2024-03-10 NOTE — Progress Notes (Signed)
 CK level was increased higher than before at 1,196. Her liver enzyme tests are abnormal but this is likely an effect of the high CK level. Like we discussed with her having minimal symptoms I wouldn't increase medications for this, but also would not decrease at all on her azathioprine  or prednisone .  Cholesterol levels are good and blood sugars are good with no problem from the low dose steroids.

## 2024-03-15 ENCOUNTER — Ambulatory Visit
Admission: RE | Admit: 2024-03-15 | Discharge: 2024-03-15 | Disposition: A | Discharge status: Home / Self Care | Source: Ambulatory Visit | Attending: Nurse Practitioner | Admitting: Nurse Practitioner

## 2024-03-15 ENCOUNTER — Other Ambulatory Visit: Payer: Self-pay | Admitting: Nurse Practitioner

## 2024-03-15 DIAGNOSIS — R0781 Pleurodynia: Secondary | ICD-10-CM

## 2024-03-18 ENCOUNTER — Other Ambulatory Visit: Payer: Self-pay | Admitting: Gastroenterology

## 2024-03-18 DIAGNOSIS — R1011 Right upper quadrant pain: Secondary | ICD-10-CM

## 2024-03-18 DIAGNOSIS — K754 Autoimmune hepatitis: Secondary | ICD-10-CM

## 2024-03-22 ENCOUNTER — Other Ambulatory Visit: Payer: Self-pay | Admitting: Internal Medicine

## 2024-03-22 DIAGNOSIS — M069 Rheumatoid arthritis, unspecified: Secondary | ICD-10-CM

## 2024-03-22 DIAGNOSIS — J849 Interstitial pulmonary disease, unspecified: Secondary | ICD-10-CM

## 2024-03-23 NOTE — Telephone Encounter (Signed)
 Last Fill: 12/02/2023  Next Visit: 06/08/2024  Last Visit: 03/09/2024  Dx: Rheumatoid arthritis, involving unspecified site, unspecified whether rheumatoid factor present   Current Dose per office note on 03/09/2024: prednisone  5 mg daily   Okay to refill Prednisone ?

## 2024-03-25 ENCOUNTER — Ambulatory Visit
Admission: RE | Admit: 2024-03-25 | Discharge: 2024-03-25 | Disposition: A | Discharge status: Home / Self Care | Source: Ambulatory Visit | Attending: Gastroenterology | Admitting: Gastroenterology

## 2024-03-25 DIAGNOSIS — K754 Autoimmune hepatitis: Secondary | ICD-10-CM

## 2024-03-25 DIAGNOSIS — R1011 Right upper quadrant pain: Secondary | ICD-10-CM

## 2024-03-26 ENCOUNTER — Other Ambulatory Visit (HOSPITAL_COMMUNITY): Payer: Self-pay | Admitting: Gastroenterology

## 2024-03-26 DIAGNOSIS — R1011 Right upper quadrant pain: Secondary | ICD-10-CM

## 2024-04-19 ENCOUNTER — Encounter (HOSPITAL_COMMUNITY)
Admission: RE | Admit: 2024-04-19 | Discharge: 2024-04-19 | Disposition: A | Discharge status: Home / Self Care | Source: Ambulatory Visit | Attending: Gastroenterology | Admitting: Gastroenterology

## 2024-04-19 DIAGNOSIS — R1011 Right upper quadrant pain: Secondary | ICD-10-CM | POA: Diagnosis present

## 2024-04-19 MED ORDER — TECHNETIUM TC 99M MEBROFENIN IV KIT
5.0000 | PACK | Freq: Once | INTRAVENOUS | Status: AC | PRN
Start: 2024-04-19 — End: 2024-04-19
  Administered 2024-04-19: 5.28 via INTRAVENOUS

## 2024-05-07 ENCOUNTER — Other Ambulatory Visit: Payer: Self-pay | Admitting: Nurse Practitioner

## 2024-05-07 ENCOUNTER — Encounter: Payer: Self-pay | Admitting: Nurse Practitioner

## 2024-05-07 DIAGNOSIS — R109 Unspecified abdominal pain: Secondary | ICD-10-CM

## 2024-05-07 DIAGNOSIS — R350 Frequency of micturition: Secondary | ICD-10-CM

## 2024-05-31 NOTE — Progress Notes (Signed)
 "  Office Visit Note  Patient: Tina Gray             Date of Birth: 1965-11-14           MRN: [id number]             PCP: Delores Rojelio Caldron, NP Referring: Delores Rojelio Caldron, NP Visit Date: 06/08/2024 Interpreter: Richardson CHRISTELLA- CAP  Subjective:   Discussed the use of AI scribe software for clinical note transcription with the patient, who gave verbal consent to proceed.  History of Present Illness   Tina Gray is a 58 y.o. 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    She has elevated CK levels, noted to be 1100, without any specific symptoms associated with this elevation. Despite the high CK levels, she has not experienced any significant changes in her condition.  She experiences persistent musculoskeletal pain, particularly in her left shoulder and right calf. The left shoulder pain is described as soreness, exacerbated by movements such as reaching overhead or putting on a coat, and includes a 'twinge' with certain arm movements. The right calf pain is severe at times, especially after prolonged sitting and then standing, and can be distracting but is often ignored during her busy work schedule.  She has a history of sleep disturbances over the past five years, attributing it to not sleeping well, which contributes to her feeling tired.  She describes a sensation of her spine 'moving' or 'popping' during her fast-paced work, sometimes requiring her to stop and sit down.  She has a fast-paced job that requires prolonged standing and physical activity, which may contribute to her musculoskeletal symptoms. No new rashes, only dry skin, and no recent illnesses since her last visit.       Previous HPI 03/09/2024 Tina Gray is a 58 y.o. 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      She has a new onset cough that  began yesterday, described as 'awful' and causing fatigue. She is uncertain if it worsens at night compared to the daytime. She continues to experience postnasal drip, which has been a persistent issue.   She reports dry skin on her face and hands, particularly around the fingertips, noticeable for about a month. The dryness has worsened over time, and she describes the skin as 'dry, dry' and sometimes cold in those areas. She has not been moisturizing her hands regularly.   She mentions a history of wrist pain, which has improved over time and is now less frequent. She also has a history of neck pain associated with arthritis, described as a 'pop, pop, pops' sensation when turning her neck, particularly on one side. The pain is described as 'a little bit hurts'.   She is currently taking her medications as prescribed and feels normal with them.     Encounter was conducted with assistance of in person sign language interpreter     Previous HPI 12/08/2023 Tina Gray is a 58 y.o. 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     Her recent chest scan showed no changes compared to the scan from 2023.  She has not experienced any persistent symptoms with cough dyspnea or chest pain.  She experiences inflammation primarily in her feet, which she attributes to walking a lot at work. The inflammation worsens throughout the day but improves by morning. Her feet are swollen daily, and she tries to elevate them when possible. Wearing compression socks helps with the swelling.   She is concerned about the impact of her medication on her blood test results, noting that her blood test numbers have not improved.    No recent illness, color changes, or circulation problems in her feet. She experiences pain in her back and hip area, which she describes as 'the same as it's always been.' She also mentions a history of plantar  fasciitis due to being on her feet a lot.   She takes eight medications in total and wants to reduce her medication burden.      Previous HPI 09/03/2023 Tina Gray is a 58 y.o. 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 Overall symptoms have been under control without any worsening in cough, shortness of breath, and no weakness or joint swelling. She does have some increase in shoulder pain on the left side. This hurts with lying on the side, lifting anything heavy, and overhead movement. Not radiating in the arm. Symptoms improve partially with her medications and at rest.   She has been sick with URI symptoms shared with her grandchildren but improved without additional medication.   Encounter was conducted with assistance of in person sign language interpreter.       Previous HPI 06/05/23 Tina Gray is a 58 y.o. 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  She has a few painful areas of complaint today.  Has a new nodule on the top side of her right middle finger.  The pain is exacerbated by contact and occasionally radiates to the ring finger and down to the wrist.  She does not recall any injury or change in activity associated. The patient also reports a deep, internal pain in their thigh, which becomes particularly severe during periods of standing or walking. The patient has a history of receiving injections for arthritis in the thigh in 2019, and wonders if this could be related to the current pain. The patient denies any swelling or bumps in the leg, and reports that the knee is generally fine, although it occasionally swells.   The patient also reports pain in their shoulder, which they describe as a sharp, jabbing sensation, particularly when lifting or turning  their arm in certain ways. The pain sometimes radiates down the arm. The patient's work involves lifting boxes, which may exacerbate the shoulder pain. The patient has a history of receiving injections for knee pain, which they report provided some relief.    Encounter was conducted with assistance of in person sign language interpreter.   Previous HPI 03/06/2023 MELESA LECY is a 58 y.o. 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  Last CK level was slightly more elevated over 400 but without definite corresponding symptoms.  Currently has some increased pain in both knees.  Tenderness is mostly towards the medial side of the joint.  Especially with certain twisting movements.  Not  seeing any visible swelling.  She broke her right fifth toe forcibly opening a door onto it evaluated by podiatry this has improved with no particular intervention.  Also has pain on the bottoms of both heels this is most intense first thing in the morning and worse on left foot.  No cough or shortness of breath.   Encounter was conducted with the assistance of in person sign language interpreter.   Previous HPI 12/02/2022 PATRECE TALLIE is a 58 y.o. 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 Since our last visit she broke her right 5th toe by shutting a door on it and had this reduced externally. Has some increased pain in the shoulders and in right knee. Not noticing visible swelling. Shoulder pain is worst when trying to pull or lift heavy weight. Knee pain feels worst at the back of the knee. She has had similar pain in the past for which she had steroid injections years ago, but had been doing well off any specific treatment.   Previous  HPI 08/28/22 ANNABEL GIBEAU is a 58 y.o. female here for follow up for seropositive RA with overlap associated ILD and persistent elevated CK on Imuran  150 mg daily and prednisone  5 mg daily.  Azathioprine  dose was increased after last visit due to some ongoing joint pains and abnormal lab results.  She is not noticed any problems since increasing the medication.  Still has right shoulder pain but less severe compared to at our visit in December.  No peripheral joint swelling.  Small amounts of skin rash intermittently but not causing a lot of symptoms.  She had recent follow-up with Dr. Jude in pulmonology clinic findings are stable.  Not experiencing much shortness of breath does have productive cough with sputum early each morning.     Previous HPI 05/29/22 CAMILLIA MARCY is a 58 y.o. female here for follow up for seropositive RA with associated ILD on prednisone  imuran  100 mg daily and prednisone  5 mg daily.  Since decreasing the prednisone  from 10 mg to 5 mg daily she has had increased soreness particularly at her bilateral shoulders.  This bothers her mostly throughout the day and a lot of time when trying to lift up with her arms or reach overhead.  She is also had some increase in rash on her elbows and forearms.  She states this has been previously attributed to psoriasis rash.  She uses a moisturizing lotion no other topical treatments.  No rash breaking out elsewhere on her body.  She is tolerating the azathioprine  100 mg without any noticeable side effect but has not seen an improvement in pain or fatigue or rash since increasing this.     Previous HPI 03/20/22 MONCERRATH BERHE is a 58 y.o. female here for follow up for rheumatoid arthritis with associated ILD currently on prednisone  5 mg daily and azathioprine  50 mg daily.  We decreased the steroid dose from 10 mg after the last visit as symptoms appear well controlled and aiming to minimize side effect risks.  She has noticed some increased pain at  multiple areas especially in both upper extremities and at the top of her neck since reducing the prednisone .  She is not typically seeing a lot of swelling but more pain or sensitivity to pressure.  The pain in her neck is right near the  base of the skull describes tingling or burning in character and radiates along the back of her neck or back of her scalp periodically.  She particularly feels the worse symptoms when extending her neck.  Follow-up with pulmonology clinic Dr. Jude review of most recent CT scan looks stable.  Lab work checked at last visit was negative for RA serology.  She did have a moderately elevated CK level 1178.  Liver function test have returned to a normal baseline.  Sedimentation rate and CRP were also normal.  She did have a positive ANA 1:80 titer.     Previous HPI 02/20/2022 KAYE LUOMA is a 58 y.o. female originally from [place] here for rheumatoid arthritis previously seen with Dr. Ishmael. 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  She has some chronic cough associated but does not get particularly short of breath at baseline or significantly limiting activities.  This ILD has been attributed most likely suspected due to underlying RA but also apparently question of possible myositis in history as well. Original onset of joint pains and swelling at multiple areas including hands hips feet originally evaluated for rheumatology in 2018.  She had treatment for bilateral carpal tunnel syndrome with good improvement of hand symptoms.  However since then has had persistent joint pains pretty much all the time if she is not on a sufficient dose of prednisone .  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  Currently azathioprine  50 mg daily and prednisone  10 mg daily.  We will have some ongoing pain at  her hip and feet but elsewhere doing fine and the symptoms are manageable while on the current steroid dose. She had gastroenterology evaluations as well in 2018 with abnormal LFTs but liver biopsy unremarkable for active inflammatory disease process.  Also significant work-up for dysphagia with manometry nonspecific for a cause.   Hip xray 01/30/22 Mild OA   Review of Systems  Constitutional:  Negative for fatigue.  HENT:  Negative for mouth sores and mouth dryness.   Eyes:  Positive for dryness.  Respiratory:  Negative for shortness of breath.   Cardiovascular:  Negative for chest pain and palpitations.  Gastrointestinal:  Negative for blood in stool, constipation and diarrhea.  Endocrine: Positive for increased urination.  Genitourinary:  Positive for involuntary urination.  Musculoskeletal:  Positive for joint pain, gait problem, joint pain, joint swelling, myalgias, morning stiffness and myalgias. Negative for muscle weakness and muscle tenderness.  Skin:  Negative for color change, rash, hair loss and sensitivity to sunlight.  Allergic/Immunologic: Negative for susceptible to infections.  Neurological:  Positive for dizziness. Negative for headaches.  Hematological:  Negative for swollen glands.  Psychiatric/Behavioral:  Positive for sleep disturbance. Negative for depressed mood. The patient is not nervous/anxious.     PMFS History:  Patient Active Problem List   Diagnosis Date Noted   Long term current use of systemic steroids 03/09/2024   Bilateral knee pain 03/06/2023   High risk medication use 02/20/2022   Unilateral primary osteoarthritis, left hip 02/20/2022   Bilateral plantar fasciitis 02/20/2022   Pulmonary nodule, left 02/20/2022   Chronic cough 08/10/2021   Rheumatoid arthritis (HCC) 08/10/2021   ILD (interstitial lung disease) (HCC) 11/28/2020   Dysphagia    Chest pain at rest 12/02/2015   HTN (hypertension) 12/02/2015   Hypokalemia 12/02/2015   Elevated  troponin 12/02/2015   Chest pain 12/02/2015   Right arm pain 12/02/2015   Abnormal uterine bleeding 03/17/2013   Anemia, iron deficiency 03/05/2013    Past Medical History:  Diagnosis Date   Carpal tunnel syndrome    Chest pain    Deaf    GERD (gastroesophageal reflux disease)    Hypertension    Rheumatoid arthritis (HCC)     Family History  Problem Relation Age of Onset   Heart attack Mother    Heart disease Mother    Cancer Father    Esophageal cancer Father    Prostate cancer Brother    Healthy Son    Healthy Son    Colon cancer Neg Hx    Rectal cancer Neg Hx    Stomach cancer Neg Hx    Past Surgical History:  Procedure Laterality Date   ESOPHAGEAL MANOMETRY N/A 03/13/2016   Procedure: ESOPHAGEAL MANOMETRY (EM);  Surgeon: Victory LITTIE Legrand DOUGLAS, MD;  Location: [facility];  Service: Gastroenterology;  Laterality: N/A;   LAPAROSCOPIC OVARIAN CYSTECTOMY     Social History   Social History Narrative   Not on file   Immunization History  Administered Date(s) Administered   Influenza,inj,Quad PF,6+ Mos 03/15/2013   Influenza-Unspecified 04/23/2023   PNEUMOCOCCAL CONJUGATE-20 02/20/2022     Objective: Vital Signs: BP (!) 83/50   Pulse (!) 58   Temp (!) 97.3 F (36.3 C)   Resp 16   Ht 5' 6 (1.676 m)   Wt 140 lb 6.4 oz (63.7 kg)   LMP 06/10/2014   BMI 22.66 kg/m    Physical Exam Eyes:     Conjunctiva/sclera: Conjunctivae normal.  Cardiovascular:     Rate and Rhythm: Normal rate and regular rhythm.  Pulmonary:     Effort: Pulmonary effort is normal.     Breath sounds: Normal breath sounds.  Lymphadenopathy:     Cervical: No cervical adenopathy.  Skin:    General: Skin is warm and dry.     Findings: Rash present.     Comments: Extremely dry skin on bilateral hands with some peeling  Neurological:     Mental Status: She is alert.  Psychiatric:        Mood and Affect: Mood normal.      Musculoskeletal Exam:  Neck full ROM intact but pain worst in  back of beck with side to side rotataion while held in extended position Shoulders full ROM, left shoulder tenderness at lateral border of acromion Elbows full ROM no tenderness or swelling Wrists full ROM no tenderness or swelling Fingers full ROM no tenderness or swelling Lateral hip tenderness to pressure b/l, right hip pain with resisted abduction and clamshell movement Knees full ROM no tenderness or swelling Pain on right leg lateral gastroc and just below knee, no pain at ankle, no swelling Ankles full ROM no tenderness or swelling  Investigation: No additional findings.  Imaging: US  PELVIS (TRANSABDOMINAL ONLY) Result Date: 06/08/2024 EXAM: PELVIC ULTRASOUND TECHNIQUE: Transabdominal pelvic duplex ultrasound using B-mode/gray scaled imaging with Doppler spectral analysis and color flow was obtained. Examination is limited secondary to transabdominal scanning only. COMPARISON: None available. CLINICAL HISTORY: ABDOMINAL PAIN, FREQUENT URINATION, URINE NEGATIVE FOR UTI FINDINGS: ULTRASOUND FINDINGS: UTERUS: Uterus measures 5.3 x 2.5 x 4.0 cm. Uterus demonstrates grossly normal myometrial echotexture. ENDOMETRIAL STRIPE: The endometrium is not well seen. The visualized portion appears normal measuring 6 mm in thickness. RIGHT OVARY: Right ovary measures 1.3 x 1.4 x 1.0 cm and is grossly imaged within  normal limits. There is normal arterial and venous Doppler flow. LEFT OVARY: The left ovary is not visualized. FREE FLUID: No free fluid. IMPRESSION: 1. Limited examination due to transabdominal scanning only. 2. Left ovary not visualized. Electronically signed by: Greig Pique MD 06/08/2024 06:14 PM EST RP Workstation: [id number]    Recent Labs: Lab Results  Component Value Date   WBC 5.9 06/08/2024   HGB 13.3 06/08/2024   PLT 243 06/08/2024   NA 138 06/08/2024   K 4.1 06/08/2024   CL 102 06/08/2024   CO2 30 06/08/2024   GLUCOSE 72 06/08/2024   BUN 12 06/08/2024   CREATININE 0.73  06/08/2024   BILITOT 0.6 06/08/2024   ALKPHOS 53 02/20/2022   AST 24 06/08/2024   ALT 25 06/08/2024   PROT 7.1 06/08/2024   ALBUMIN 4.0 02/20/2022   CALCIUM 9.1 06/08/2024   GFRAA >60 04/14/2017   QFTBGOLDPLUS NEGATIVE 02/20/2022    Speciality Comments: No specialty comments available.  Procedures:  No procedures performed Allergies: Patient has no known allergies.   Assessment / Plan:     Visit Diagnoses: Rheumatoid arthritis, involving unspecified site, unspecified whether rheumatoid factor present (HCC) - Plan: azaTHIOprine  (IMURAN ) 50 MG tablet, predniSONE  (DELTASONE ) 5 MG tablet, CK Wrist pain significantly decreased compared to last visit without direct intervention, almost normal. No medication issues or new symptoms of active disease.  If CK has improved further we might build to titrate down her medication.  If staying moderately elevated in the 500s or higher I would not recommend any medication reduction currently. - Checking CK level for disease activity monitoring - Continue azathioprine  150 mg daily - Continue prednisone  5 mg daily  ILD (interstitial lung disease) (HCC) - Plan: azaTHIOprine  (IMURAN ) 50 MG tablet, predniSONE  (DELTASONE ) 5 MG tablet, CK  High risk medication use - imuran  150 mg daily - Plan: CBC with Differential/Platelet, Comprehensive metabolic panel with GFR - Checking CBC CMP for medication monitoring on long-term use of azathioprine   Hip and shoulder tendinopathy Intermittent left shoulder and hip pain due to tendinitis and muscle weakness. Asymmetrical muscle protrusion suggests compensation. - Provided hip exercise printouts. - Encouraged muscle strengthening and stability exercises.        Orders: Orders Placed This Encounter  Procedures   CK   CBC with Differential/Platelet   Comprehensive metabolic panel with GFR   Meds ordered this encounter  Medications   azaTHIOprine  (IMURAN ) 50 MG tablet    Sig: Take 2 tablets (100 mg total)  by mouth daily.    Dispense:  180 tablet    Refill:  0   predniSONE  (DELTASONE ) 5 MG tablet    Sig: Take 1 tablet (5 mg total) by mouth daily with breakfast.    Dispense:  90 tablet    Refill:  0     Follow-Up Instructions: Return in about 3 months (around 09/06/2024) for RA/ILD on AZA/GC f/u 3mos.   Lonni LELON Ester, MD  Note - This record has been created using [employer].  Chart creation errors have been sought, but may not always  have been located. Such creation errors do not reflect on  the standard of medical care. "

## 2024-06-03 ENCOUNTER — Ambulatory Visit
Admission: RE | Admit: 2024-06-03 | Discharge: 2024-06-03 | Disposition: A | Discharge status: Home / Self Care | Source: Ambulatory Visit | Attending: Nurse Practitioner | Admitting: Nurse Practitioner

## 2024-06-03 DIAGNOSIS — R109 Unspecified abdominal pain: Secondary | ICD-10-CM

## 2024-06-08 ENCOUNTER — Encounter: Payer: Self-pay | Admitting: Internal Medicine

## 2024-06-08 ENCOUNTER — Ambulatory Visit: Attending: Internal Medicine | Admitting: Internal Medicine

## 2024-06-08 VITALS — BP 83/50 | HR 58 | Temp 97.3°F | Resp 16 | Ht 66.0 in | Wt 140.4 lb

## 2024-06-08 DIAGNOSIS — Z79899 Other long term (current) drug therapy: Secondary | ICD-10-CM

## 2024-06-08 DIAGNOSIS — Z7952 Long term (current) use of systemic steroids: Secondary | ICD-10-CM | POA: Diagnosis not present

## 2024-06-08 DIAGNOSIS — J849 Interstitial pulmonary disease, unspecified: Secondary | ICD-10-CM

## 2024-06-08 DIAGNOSIS — M069 Rheumatoid arthritis, unspecified: Secondary | ICD-10-CM

## 2024-06-08 LAB — CBC WITH DIFFERENTIAL/PLATELET
Absolute Lymphocytes: 997 {cells}/uL (ref 850–3900)
Absolute Monocytes: 543 {cells}/uL (ref 200–950)
Basophils Absolute: 41 {cells}/uL (ref 0–200)
Basophils Relative: 0.7 %
Eosinophils Absolute: 153 {cells}/uL (ref 15–500)
Eosinophils Relative: 2.6 %
HCT: 40.7 % (ref 35.9–46.0)
Hemoglobin: 13.3 g/dL (ref 11.7–15.5)
MCH: 31.4 pg (ref 27.0–33.0)
MCHC: 32.7 g/dL (ref 31.6–35.4)
MCV: 96.2 fL (ref 81.4–101.7)
MPV: 11.5 fL (ref 7.5–12.5)
Monocytes Relative: 9.2 %
Neutro Abs: 4165 {cells}/uL (ref 1500–7800)
Neutrophils Relative %: 70.6 %
Platelets: 243 Thousand/uL (ref 140–400)
RBC: 4.23 Million/uL (ref 3.80–5.10)
RDW: 13 % (ref 11.0–15.0)
Total Lymphocyte: 16.9 %
WBC: 5.9 Thousand/uL (ref 3.8–10.8)

## 2024-06-08 LAB — COMPREHENSIVE METABOLIC PANEL WITH GFR
AG Ratio: 1.4 (calc) (ref 1.0–2.5)
ALT: 25 U/L (ref 6–29)
AST: 24 U/L (ref 10–35)
Albumin: 4.1 g/dL (ref 3.6–5.1)
Alkaline phosphatase (APISO): 64 U/L (ref 37–153)
BUN: 12 mg/dL (ref 7–25)
CO2: 30 mmol/L (ref 20–32)
Calcium: 9.1 mg/dL (ref 8.6–10.4)
Chloride: 102 mmol/L (ref 98–110)
Creat: 0.73 mg/dL (ref 0.50–1.05)
Globulin: 3 g/dL (ref 1.9–3.7)
Glucose, Bld: 72 mg/dL (ref 65–99)
Potassium: 4.1 mmol/L (ref 3.5–5.3)
Sodium: 138 mmol/L (ref 135–146)
Total Bilirubin: 0.6 mg/dL (ref 0.2–1.2)
Total Protein: 7.1 g/dL (ref 6.1–8.1)
eGFR: 94 mL/min/1.73m2 (ref >=60)

## 2024-06-08 LAB — CK: Total CK: 567 U/L — ABNORMAL HIGH (ref 20–243)

## 2024-06-08 MED ORDER — PREDNISONE 5 MG PO TABS: 5.0000 mg | ORAL_TABLET | Freq: Every day | ORAL | 0 refills | Status: AC

## 2024-06-08 MED ORDER — AZATHIOPRINE 50 MG PO TABS: 100.0000 mg | ORAL_TABLET | Freq: Every day | ORAL | 0 refills | Status: AC

## 2024-06-08 NOTE — Patient Instructions (Signed)
 Straight leg raises, side-lying This exercise strengthens the muscles that rotate the leg at the hip and move it away from your body (hip abductors). Lie on your side with your left / right leg in the top position. Lie so your head, shoulder, knee, and hip line up. Bend your bottom knee slightly to help you balance. Lift your top leg 4-6 inches (10-15 cm) while keeping your toes pointed straight ahead. Hold this position for __________ seconds. Slowly lower your leg to the starting position. Let your muscles relax completely after each repetition. Repeat __________ times. Complete this exercise __________ times a day. Hip abduction, quadruped This is an exercise in which your hands and knees are on the floor, and you lift one knee out to the side. Get on your hands and knees on a firm, lightly padded surface. Your hands should be directly below your shoulders, and your knees should be directly below your hips. Lift your left / right knee out to the side. Keep your knee bent. Do not twist your body. Hold this position for __________ seconds. Slowly lower your leg back to the starting position. Repeat __________ times. Complete this exercise __________ times a day.

## 2024-06-25 ENCOUNTER — Encounter

## 2024-07-26 ENCOUNTER — Encounter

## 2024-07-26 ENCOUNTER — Ambulatory Visit: Admitting: Primary Care

## 2024-07-26 DIAGNOSIS — R911 Solitary pulmonary nodule: Secondary | ICD-10-CM

## 2024-07-26 DIAGNOSIS — J849 Interstitial pulmonary disease, unspecified: Secondary | ICD-10-CM

## 2024-09-06 ENCOUNTER — Ambulatory Visit: Admitting: Internal Medicine

## 2024-09-15 ENCOUNTER — Ambulatory Visit: Admitting: Primary Care

## 2024-09-15 ENCOUNTER — Encounter
